# Patient Record
Sex: Female | Born: 1980
Health system: Southern US, Community
[De-identification: ages and names within clinical notes are randomized; demographics above are authoritative.]

## PROBLEM LIST (undated history)

## (undated) DIAGNOSIS — T7840XA Allergy, unspecified, initial encounter: Secondary | ICD-10-CM

## (undated) DIAGNOSIS — G971 Other reaction to spinal and lumbar puncture: Secondary | ICD-10-CM

## (undated) DIAGNOSIS — K219 Gastro-esophageal reflux disease without esophagitis: Secondary | ICD-10-CM

## (undated) DIAGNOSIS — Z9889 Other specified postprocedural states: Secondary | ICD-10-CM

## (undated) DIAGNOSIS — J45909 Unspecified asthma, uncomplicated: Secondary | ICD-10-CM

## (undated) DIAGNOSIS — Z86018 Personal history of other benign neoplasm: Secondary | ICD-10-CM

## (undated) DIAGNOSIS — R112 Nausea with vomiting, unspecified: Secondary | ICD-10-CM

## (undated) DIAGNOSIS — A609 Anogenital herpesviral infection, unspecified: Secondary | ICD-10-CM

## (undated) DIAGNOSIS — D649 Anemia, unspecified: Secondary | ICD-10-CM

## (undated) HISTORY — PX: MYOMECTOMY: SHX85

## (undated) HISTORY — DX: Allergy, unspecified, initial encounter: T78.40XA

---

## 2005-03-31 ENCOUNTER — Ambulatory Visit: Payer: Self-pay | Admitting: Internal Medicine

## 2007-08-30 DIAGNOSIS — G971 Other reaction to spinal and lumbar puncture: Secondary | ICD-10-CM

## 2007-08-30 HISTORY — DX: Other reaction to spinal and lumbar puncture: G97.1

## 2009-11-20 ENCOUNTER — Ambulatory Visit: Payer: Self-pay | Admitting: Internal Medicine

## 2009-11-20 DIAGNOSIS — R05 Cough: Secondary | ICD-10-CM

## 2009-11-20 DIAGNOSIS — R059 Cough, unspecified: Secondary | ICD-10-CM | POA: Insufficient documentation

## 2010-09-28 NOTE — Assessment & Plan Note (Signed)
Summary: Pulmonary/ new pt eval for cough   Visit Type:  Initial Consult Copy to:  Self Primary Provider/Referring Provider:  none  CC:  Cough.  History of Present Illness: 76 yobf never smoker with h/o allergic rhinitis on clariton short courses only until mid 20's and resolved  2006 seen in pulmonary clinic for cyclical cough rx delsym tramadol and ppi and resolved completely.  November 20, 2009 cc recurrent cough x 6 weeks onset viral uri with sorethroat rx codeine cough syrup and as needed proventil no help.  URI and sore throat symptoms resolved but cough since then persistent not progressive, worse day than night and no noct disturbance or early am exac, mostly dry.  cxr and sinus xrays nl already done.  Pt denies any significant sore throat, dysphagia, itching, sneezing,  nasal congestion or excess secretions,  fever, chills, sweats, unintended wt loss, pleuritic or exertional cp, hempoptysis, change in activity tolerance  orthopnea pnd or leg swelling Pt also denies any obvious fluctuation in symptoms with weather or environmental change or other alleviating or aggravating factors.         Current Medications (verified): 1)  Proventil Hfa 108 (90 Base) Mcg/act Aers (Albuterol Sulfate) .... 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  The patient denies shortness of breath with activity, shortness of breath at rest, productive cough, coughing up blood, chest pain, irregular heartbeats, acid heartburn, indigestion, loss of appetite, weight change, abdominal pain, difficulty swallowing, tooth/dental problems, headaches, nasal congestion/difficulty  breathing through nose, sneezing, itching, ear ache, anxiety, depression, hand/feet swelling, joint stiffness or pain, rash, change in color of mucus, and fever.    Vital Signs:  Patient profile:   30 year old female Height:      62 inches Weight:      149 pounds BMI:     27.35 O2 Sat:      99 % on Room air Temp:     98.4 degrees F oral Pulse rate:   85 / minute BP sitting:   112 / 70  (left arm)  Vitals Entered By: Vernie Murders (November 20, 2009 10:33 AM)  O2 Flow:  Room air  Physical Exam  Additional Exam:  wt 149 November 21, 2009 amb pleasant bf nad HEENT: nl dentition, and orophanx. mod nonspecific turbinate edema.  Nl external ear canals without cough reflex NECK :  without JVD/Nodes/TM/ nl carotid upstrokes bilaterally LUNGS: no acc muscle use, clear to A and P bilaterally with cough on insp  CV:  RRR  no s3 or murmur or increase in P2, no edema  ABD:  soft and nontender with nl excursion in the supine position. No bruits or organomegaly, bowel sounds nl MS:  warm without deformities, calf tenderness, cyanosis or clubbing SKIN: warm and dry without lesions   NEURO:  alert, approp, no deficits     Impression & Recommendations:  Problem # 1:  COUGH (ICD-786.2)  The most common causes of chronic cough in immunocompetent adults include: upper airway cough syndrome (UACS), previously referred to as postnasal drip syndrome,  caused by variety of rhinosinus conditions; (2) asthma; (3) GERD; (4) chronic bronchitis from cigarette smoking or other inhaled environmental irritants; (5) nonasthmatic eosinophilic  bronchitis; and (6) bronchiectasis. These conditions, singly or in combination, have accounted for up to 94% of the causes of chronic cough in prospective studies.   Of the three most common causes of chronic cough, only one (GERD) can actually trigger or exacerbate the other two and perpetuate the cylce of cough inducing airway trauma, inflammation, heightened sensitivity to  reflux which is prompted by the cough itself via a cyclical mechanism.  This may partially respond to steroids and look like asthma and post nasal drainage but never erradicated completely unless the cough and the secondary reflux are eliminated, preferably both at the same time.    See instructions for specific recommendations   Orders: New Patient Level V (14782)  Medications Added to Medication List This Visit: 1)  Proventil Hfa 108 (90 Base) Mcg/act Aers (Albuterol sulfate) .... 2 puffs every 4-6 hours as needed 2)  Tramadol Hcl 50 Mg Tabs (Tramadol hcl) .... One to two by mouth every 4-6 hours if coughing 3)  Prednisone 10 Mg Tabs (Prednisone) .... 4 each am x 2days, 2x2days, 1x2days and stop  Patient Instructions: 1)  Acid reflux is a  leading suspect here and needs to be eliminated  completely before considering additional studies or treatment options. To suppress this maximally, take prilosec 20mg   Take  one 30-60 min before first meal of the day until cough is gone 2)  Take delsym two tsp every 12 hours and add tramadol 50 mg up to 2 every 4 hours to suppress the urge to cough. Swallowing water or using ice chips/non mint and menthol containing candies (such as lifesavers or sugarless jolly ranchers) are also effective.  3)  Prednisone 4 each am x 2days, 2x2days, 1x2days and stop  4)  GERD (REFLUX)  is a common cause of respiratory symptoms. It commonly presents without heartburn and can be treated with medication, but also with lifestyle changes including avoidance of late meals, excessive alcohol, smoking cessation, and avoid fatty foods, chocolate, peppermint, colas, red wine, and acidic juices such as orange juice. NO MINT OR MENTHOL PRODUCTS SO NO COUGH DROPS  5)  USE SUGARLESS CANDY INSTEAD (jolley ranchers)  6)  NO OIL BASED VITAMINS  Prescriptions: PREDNISONE 10 MG  TABS (PREDNISONE) 4 each am x 2days, 2x2days, 1x2days and stop  #14 x 0   Entered and Authorized by:   Nyoka Cowden MD   Signed by:   Nyoka Cowden MD on 11/20/2009   Method used:   [facility]ronically to        [facility] [address] [phone]* (retail)       [address]       [place], [place]  [id number]       Ph: [phone] or [phone]       Fax: [phone]   RxID:   [id number] TRAMADOL HCL 50 MG  TABS (TRAMADOL HCL) One to two by mouth every 4-6 hours if coughing  #40 x 0   Entered and Authorized by:   Nyoka Cowden MD   Signed by:   Nyoka Cowden MD on 11/20/2009   Method used:   [facility]        [facility]  [address] [phone] (retail)       [address]       [place], [place]  [id number]       Ph: [phone]  or [phone]       Fax: [phone]   RxID:   [id number]

## 2011-01-31 ENCOUNTER — Telehealth: Payer: Self-pay | Admitting: Internal Medicine

## 2011-01-31 NOTE — Telephone Encounter (Signed)
All I can recommend is otcs at this point:  Prilosec Take 2 puffs first thing in am and then another 2 puffs about 12 hours later and pepcid at bedtime 20 mg  For cough > delsym is the strongest non-narcotic available, and we can't call in anything stronger.  One option would be to go to an Urgent care and give their fax and we can sent the last ov so they can teat her the same way we did - that way she doesn't have to wait to see a specialist.

## 2011-01-31 NOTE — Telephone Encounter (Signed)
Pt advised of recs.Samantha Francis, CMA  

## 2011-01-31 NOTE — Telephone Encounter (Signed)
Samantha last seen 11-20-2009 by Dr. Sherene Sires for cough. Samantha states she lives in [place] and MW told her at last OV she could call in if she needed an RX and he would try to help her because she lives so far away. Samantha states she has been having a dry cough x 1 month again. She also has head congestion as well.  She states she has tried to get an appt in [place] but it is a 2 month waiting period. I advised the Samantha that she had to be seen within a year for an Rx to be called in. She states she understand but if I could just ask Dr. Sherene Sires she thinks he would make an exception. Samantha is not currently on any meds.  I advised I would need to send this directly to MW but he is not in office until tomorrow AM. Samantha ok to wait till then for response. Please advise. Carron Curie, CMA

## 2011-02-01 ENCOUNTER — Telehealth: Payer: Self-pay | Admitting: *Deleted

## 2011-02-01 NOTE — Telephone Encounter (Signed)
[id number]

## 2011-02-01 NOTE — Telephone Encounter (Signed)
Pt returned call.  States she is taking the prilosec twice daily and delsym for cough.  States the delsym did not help last night.  She has not started the pepcid at bedtime but states she will get some.  Pt requesting OV this Friday as she will be coming in from out of town for appt.  OV scheduled with Dr. Craige Cotta for 02/04/11 at 1:30pm as there are no openings available with Dr. Sherene Sires and TP is out of office.  Pt aware.

## 2011-02-04 ENCOUNTER — Encounter: Payer: Self-pay | Admitting: Pulmonary Disease

## 2011-02-04 ENCOUNTER — Ambulatory Visit (INDEPENDENT_AMBULATORY_CARE_PROVIDER_SITE_OTHER): Payer: Self-pay | Admitting: Pulmonary Disease

## 2011-02-04 VITALS — BP 116/78 | HR 95 | Temp 98.7°F | Ht 62.0 in | Wt 158.4 lb

## 2011-02-04 DIAGNOSIS — R05 Cough: Secondary | ICD-10-CM

## 2011-02-04 DIAGNOSIS — R059 Cough, unspecified: Secondary | ICD-10-CM

## 2011-02-04 MED ORDER — MOMETASONE FUROATE 50 MCG/ACT NA SUSP: NASAL | Status: DC

## 2011-02-04 MED ORDER — BENZONATATE 200 MG PO CAPS: 200.0000 mg | ORAL_CAPSULE | Freq: Three times a day (TID) | ORAL | Status: AC | PRN

## 2011-02-04 NOTE — Progress Notes (Signed)
Subjective:    Patient ID: Samantha Francis, female    DOB: 06-Mar-1981, 30 y.o.   MRN: [id number]  HPI 30 yo female never smoker with chronic cough.  She had a cold about 6 weeks ago. She had a cough with yellow sputum for a few days.  She was also having sinus congestion and post-nasal drip.  She is not having sputum production, but her cough persists.  She feels drainage in her throat, and has a globus/itchy feeling.  She has not had fever, wheeze, or chest tightness.  She does not smoke.  Her daughter also had a cold and cough.  She restarted her stomach medicine two days ago, and this helped some.  Past Medical History  Diagnosis Date  . Allergic rhinitis      No family history on file.   History   Social History  . Marital Status: Married    Spouse Name: N/A    Number of Children: N/A  . Years of Education: N/A   Occupational History  . community support specialist    Social History Main Topics  . Smoking status: Never Smoker   . Smokeless tobacco: Not on file  . Alcohol Use: Not on file  . Drug Use: Not on file  . Sexually Active: Not on file   Other Topics Concern  . Not on file   Social History Narrative  . No narrative on file     No Known Allergies   Outpatient Prescriptions Prior to Visit  Medication Sig Dispense Refill  . traMADol (ULTRAM) 50 MG tablet Take 50 mg by mouth every 6 (six) hours as needed.           Review of Systems     Objective:   Physical Exam  BP 116/78  Pulse 95  Temp(Src) 98.7 F (37.1 C) (Oral)  Ht 5\' 2"  (1.575 m)  Wt 158 lb 6.4 oz (71.85 kg)  BMI 28.97 kg/m2  SpO2 98%  General - Healthy HEENT - no sinus tenderness, clear nasal discharge, boggy mucosa, no oral exudate, no LAN Cardiac - s1s2 regular, no murmur Chest - CTA Abd - soft, non-tender Ext - no e/c/c Neuro - normal strength Psych - normal mood/behavior     Assessment & Plan:   Cough She has recurrent cough after recent viral URI.  She has sinus  congestion with post-nasal drip.  Will give her nasal irrigation and nasonex.  She can use salt water gargles, and sugarless candy.  Will give her script for benzonatate.  She is to continue anti-reflux therapy.  No clear evidence for asthma, and will hold off on using inhalers for now.    Updated Medication List Outpatient Encounter Prescriptions as of 02/04/2011  Medication Sig Dispense Refill  . dextromethorphan (DELSYM) 30 MG/5ML liquid Take 60 mg by mouth as needed.        . famotidine (PEPCID AC) 10 MG chewable tablet Chew 10 mg by mouth at bedtime.        Marland Kitchen omeprazole (PRILOSEC OTC) 20 MG tablet Take 20 mg by mouth every morning.        . benzonatate (TESSALON) 200 MG capsule Take 1 capsule (200 mg total) by mouth 3 (three) times daily as needed for cough.  30 capsule  1  . mometasone (NASONEX) 50 MCG/ACT nasal spray Two sprays each nostril daily for one week, then as needed  17 g  2  . DISCONTD: traMADol (ULTRAM) 50 MG tablet Take 50 mg by  mouth every 6 (six) hours as needed.

## 2011-02-04 NOTE — Assessment & Plan Note (Signed)
She has recurrent cough after recent viral URI.  She has sinus congestion with post-nasal drip.  Will give her nasal irrigation and nasonex.  She can use salt water gargles, and sugarless candy.  Will give her script for benzonatate.  She is to continue anti-reflux therapy.  No clear evidence for asthma, and will hold off on using inhalers for now.

## 2011-02-04 NOTE — Patient Instructions (Signed)
Nasal irrigation (saline nasal spray) daily for one week, then as needed Nasonex two sprays each nostril daily for one week, then as needed Salt water gargle as needed Sugarless candy as needed to keep mouth moist Drink sip of water when you have urge to cough Benzonatate one pill three times per day as needed for cough  Follow up with Dr. Sherene Sires in 4 to 6 weeks

## 2011-03-04 ENCOUNTER — Ambulatory Visit (INDEPENDENT_AMBULATORY_CARE_PROVIDER_SITE_OTHER): Payer: Self-pay | Admitting: Internal Medicine

## 2011-03-04 ENCOUNTER — Encounter: Payer: Self-pay | Admitting: Internal Medicine

## 2011-03-04 VITALS — BP 130/74 | HR 94 | Temp 97.8°F | Ht 62.0 in | Wt 158.0 lb

## 2011-03-04 DIAGNOSIS — R05 Cough: Secondary | ICD-10-CM

## 2011-03-04 DIAGNOSIS — R059 Cough, unspecified: Secondary | ICD-10-CM

## 2011-03-04 MED ORDER — FAMOTIDINE 20 MG PO TABS: ORAL_TABLET | ORAL | Status: DC

## 2011-03-04 MED ORDER — TRAMADOL HCL 50 MG PO TABS: ORAL_TABLET | ORAL | Status: AC

## 2011-03-04 MED ORDER — PREDNISONE (PAK) 10 MG PO TABS: ORAL_TABLET | ORAL | Status: AC

## 2011-03-04 NOTE — Progress Notes (Signed)
Subjective:    Patient ID: Samantha Francis, female    DOB: 03-04-1981, 30 y.o.   MRN: [id number]  Cough   30 yo female never smoker with chronic cough  Pattern of recurrent cough dating back around 2008 typically flares with uri's sev times a year but between flares has no symptoms at atll and no meds between flares several times a year and only takes bcp's as maint rx   Prev effective rx 11/20/09  1) Acid reflux is a leading suspect here and needs to be eliminated completely before considering additional studies or treatment options. To suppress this maximally, take prilosec 20mg  Take one 30-60 min before first meal of the day until cough is gone  2) Take delsym two tsp every 12 hours and add tramadol 50 mg up to 2 every 4 hours to suppress the urge to cough. Swallowing water or using ice chips/non mint and menthol containing candies (such as lifesavers or sugarless jolly ranchers) are also effective.  3) Prednisone 4 each am x 2days, 2x2days, 1x2days and stop  4) GERD diet   03/04/2011 ov/Samantha Francis  Cough no better x 2 months,  Worse at work using voice and walking, driving.  Not as much at night, minimal mucus with no discoloration at present, so far no better on ppi/ h2 at hs.  No prednisone yet this flare.  Pt denies any significant sore throat, dysphagia, itching, sneezing,  nasal congestion or excess/ purulent secretions,  fever, chills, sweats, unintended wt loss, pleuritic or exertional cp, hempoptysis, orthopnea pnd or leg swelling.    Also denies any obvious fluctuation of symptoms with weather or environmental changes or other aggravating or alleviating factors.  Sleeping ok without nocturnal  or early am exac of resp c/o's          Past Medical History  Diagnosis Date  . Allergic rhinitis      No family history on file.   History   Social History  . Marital Status: Married    Spouse Name: N/A    Number of Children: N/A  . Years of Education: N/A   Occupational  History  . community support specialist    Social History Main Topics  . Smoking status: Never Smoker   . Smokeless tobacco: Not on file  . Alcohol Use: Not on file  . Drug Use: Not on file  . Sexually Active: Not on file   Other Topics Concern  . Not on file   Social History Narrative  . No narrative on file     No Known Allergies   Outpatient Prescriptions Prior to Visit  Medication Sig Dispense Refill  . dextromethorphan (DELSYM) 30 MG/5ML liquid Take 60 mg by mouth as needed.        . famotidine (PEPCID AC) 10 MG chewable tablet Chew 10 mg by mouth at bedtime.        Marland Kitchen omeprazole (PRILOSEC OTC) 20 MG tablet Take 20 mg by mouth every morning.        . mometasone (NASONEX) 50 MCG/ACT nasal spray Two sprays each nostril daily for one week, then as needed  17 g  2     Review of Systems  Respiratory: Positive for cough.        Objective:   Physical Exam  amb bf nad  Wt 158 03/04/11   General - Healthy HEENT - no sinus tenderness, clear nasal discharge, boggy mucosa, no oral exudate, no LAN ardiac - s1s2 regular, no murmur Chest -  CTA Abd - soft, non-tender Ext - no e/c/c Neuro - normal strength Psych - normal mood/behavior     Assessment & Plan:      Outpatient Encounter Prescriptions as of 03/04/2011  Medication Sig Dispense Refill  . dextromethorphan (DELSYM) 30 MG/5ML liquid Take 60 mg by mouth as needed.        . famotidine (PEPCID AC) 10 MG chewable tablet Chew 10 mg by mouth at bedtime.        Marland Kitchen omeprazole (PRILOSEC OTC) 20 MG tablet Take 20 mg by mouth every morning.        . sodium chloride (OCEAN) 0.65 % nasal spray Place 1 spray into the nose daily.        . mometasone (NASONEX) 50 MCG/ACT nasal spray Two sprays each nostril daily for one week, then as needed  17 g  2

## 2011-03-04 NOTE — Patient Instructions (Signed)
Take delsym two tsp every 12 hours and supplement if needed with  tramadol 50 mg up to 2 every 4 hours to suppress the urge to cough. Swallowing water or using ice chips/non mint and menthol containing candies (such as lifesavers or sugarless jolly ranchers) are also effective.  You should rest your voice and avoid activities that you know make you cough.  Once you have eliminated the cough for 3 straight days try reducing the tramadol first,  then the delsym as tolerated.    Prednisone 10 mg take  4 each am x 2 days,   2 each am x 2 days,  1 each am x2days and stop   Prilosec 20mg   Take 30- 60 min before your first and last meals of the day and take pepcid 20 mg one at bedtime  For drainage try chlortrimeton 4mg  one every 6 hours (may make you sleepy)  GERD (REFLUX)  is an extremely common cause of respiratory symptoms, many times with no significant heartburn at all.    It can be treated with medication, but also with lifestyle changes including avoidance of late meals, excessive alcohol, smoking cessation, and avoid fatty foods, chocolate, peppermint, colas, red wine, and acidic juices such as orange juice.  NO MINT OR MENTHOL PRODUCTS SO NO COUGH DROPS  USE SUGARLESS CANDY INSTEAD (jolley ranchers or Stover's)  NO OIL BASED VITAMINS   Call  if not 100% satisfied to set up  sinus ct scan and allergy evaluation next

## 2011-03-05 NOTE — Assessment & Plan Note (Signed)
The most common causes of chronic cough in immunocompetent adults include the following: upper airway cough syndrome (UACS), previously referred to as postnasal drip syndrome (PNDS), which is caused by variety of rhinosinus conditions; (2) asthma; (3) GERD; (4) chronic bronchitis from cigarette smoking or other inhaled environmental irritants; (5) nonasthmatic eosinophilic bronchitis; and (6) bronchiectasis.   These conditions, singly or in combination, have accounted for up to 94% of the causes of chronic cough in prospective studies.   Other conditions have constituted no >6% of the causes in prospective studies These have included bronchogenic carcinoma, chronic interstitial pneumonia, sarcoidosis, left ventricular failure, ACEI-induced cough, and aspiration from a condition associated with pharyngeal dysfunction.   This is most likely  Classic Upper airway cough syndrome, so named because it's frequently impossible to sort out how much is  CR/sinusitis with freq throat clearing (which can be related to primary GERD)   vs  causing  secondary (" extra esophageal")  GERD from wide swings in gastric pressure that occur with throat clearing, often  promoting self use of mint and menthol lozenges that reduce the lower esophageal sphincter tone and exacerbate the problem further in a cyclical fashion.   These are the same pts who not infrequently have failed to tolerate ace inhibitors,  dry powder inhalers or biphosphonates or report having reflux symptoms that don't respond to standard doses of PPI , and are easily confused as having aecopd or asthma flares,   Of the three most common causes of chronic cough, only one (GERD)  can actually cause the other two (asthma and post nasal drip syndrome)  and perpetuate the cylce of cough inducing airway trauma, inflammation, heightened sensitivity to reflux which is prompted by the cough itself via a cyclical mechanism.    This may partially respond to steroids  and look like asthma and post nasal drainage but never erradicated completely unless the cough and the secondary reflux are eliminated, preferably both at the same time.  While not intuitively obvious, many patients with chronic low grade reflux do not cough until there is a secondary insult that disturbs the protective epithelial barrier and exposes sensitive nerve endings.  This can be viral or direct physical injury such as with an endotracheal tube.   The point is that once this occurs, it is difficult to eliminate using anything but a maximally effective acid suppression regimen at least in the short run, accompanied by an appropriate diet to address non acid GERD.   See instructions for specific recommendations which were reviewed directly with the patient who was given a copy with highlighter outlining the key components.

## 2012-05-18 ENCOUNTER — Emergency Department (HOSPITAL_COMMUNITY)
Admission: EM | Admit: 2012-05-18 | Discharge: 2012-05-18 | Disposition: A | Discharge status: Home / Self Care | Payer: No Typology Code available for payment source | Attending: Emergency Medicine | Admitting: Emergency Medicine

## 2012-05-18 ENCOUNTER — Emergency Department (HOSPITAL_COMMUNITY): Payer: No Typology Code available for payment source

## 2012-05-18 ENCOUNTER — Encounter (HOSPITAL_COMMUNITY): Payer: Self-pay | Admitting: Nurse Practitioner

## 2012-05-18 DIAGNOSIS — M545 Low back pain, unspecified: Secondary | ICD-10-CM | POA: Diagnosis not present

## 2012-05-18 DIAGNOSIS — O2690 Pregnancy related conditions, unspecified, unspecified trimester: Secondary | ICD-10-CM | POA: Diagnosis present

## 2012-05-18 DIAGNOSIS — Z3490 Encounter for supervision of normal pregnancy, unspecified, unspecified trimester: Secondary | ICD-10-CM

## 2012-05-18 DIAGNOSIS — Y92410 Unspecified street and highway as the place of occurrence of the external cause: Secondary | ICD-10-CM | POA: Insufficient documentation
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
## 2012-05-18 NOTE — ED Notes (Signed)
Pt alert and oriented, with steady gait at time of discharge. Pt given discharge papers and papers explained. All questions answered and pt walked to discharge.  

## 2012-05-18 NOTE — ED Notes (Signed)
Pt to US.

## 2013-12-26 LAB — OB RESULTS CONSOLE GC/CHLAMYDIA
Chlamydia: NEGATIVE
Gonorrhea: NEGATIVE

## 2013-12-26 LAB — OB RESULTS CONSOLE HEPATITIS B SURFACE ANTIGEN: Hepatitis B Surface Ag: NEGATIVE

## 2013-12-26 LAB — OB RESULTS CONSOLE HIV ANTIBODY (ROUTINE TESTING): HIV: NONREACTIVE

## 2013-12-26 LAB — OB RESULTS CONSOLE RUBELLA ANTIBODY, IGM: Rubella: IMMUNE

## 2013-12-26 LAB — OB RESULTS CONSOLE RPR: RPR: NONREACTIVE

## 2013-12-26 LAB — OB RESULTS CONSOLE ABO/RH: RH TYPE: POSITIVE

## 2013-12-26 LAB — OB RESULTS CONSOLE ANTIBODY SCREEN: ANTIBODY SCREEN: POSITIVE

## 2014-06-04 ENCOUNTER — Encounter (HOSPITAL_COMMUNITY): Payer: Self-pay | Admitting: Pharmacist

## 2014-06-06 ENCOUNTER — Encounter (HOSPITAL_COMMUNITY): Payer: Self-pay

## 2014-06-06 NOTE — Patient Instructions (Signed)
Your procedure is scheduled on:  Tuesday, Oct. 13, 2015  Enter through the [employer] of [facility] at:  9:45 A.M.  Pick up the phone at the desk and dial [phone].  Call this number if you have problems the morning of surgery: [phone].  Remember: Do NOT eat food:  AFTER MIDNIGHT TONIGHT Do NOT drink clear liquids after: AFTER MIDNIGHT TONIGHT Take these medicines the morning of surgery with a SIP OF WATER: NONE  Do NOT wear jewelry (body piercing), metal hair clips/bobby pins, or nail polish. Do NOT wear lotions, powders, or perfumes.  You may wear deoderant. Do NOT shave for 48 hours prior to surgery. Do NOT bring valuables to the hospital. Leave suitcase in car.  After surgery it may be brought to your room.  For patients admitted to the hospital, checkout time is 11:00 AM the day of discharge.

## 2014-06-09 ENCOUNTER — Encounter (HOSPITAL_COMMUNITY)
Admission: RE | Admit: 2014-06-09 | Discharge: 2014-06-09 | Disposition: A | Discharge status: Home / Self Care | Payer: No Typology Code available for payment source | Source: Ambulatory Visit | Attending: Obstetrics and Gynecology | Admitting: Obstetrics and Gynecology

## 2014-06-09 ENCOUNTER — Encounter (HOSPITAL_COMMUNITY): Payer: Self-pay

## 2014-06-09 HISTORY — DX: Anemia, unspecified: D64.9

## 2014-06-09 HISTORY — DX: Nausea with vomiting, unspecified: R11.2

## 2014-06-09 HISTORY — DX: Personal history of other benign neoplasm: Z86.018

## 2014-06-09 HISTORY — DX: Unspecified asthma, uncomplicated: J45.909

## 2014-06-09 HISTORY — DX: Other reaction to spinal and lumbar puncture: G97.1

## 2014-06-09 HISTORY — DX: Other specified postprocedural states: Z98.890

## 2014-06-09 HISTORY — DX: Anogenital herpesviral infection, unspecified: A60.9

## 2014-06-09 HISTORY — DX: Gastro-esophageal reflux disease without esophagitis: K21.9

## 2014-06-09 LAB — CBC
HCT: 34.4 % — ABNORMAL LOW (ref 36.0–46.0)
Hemoglobin: 11.6 g/dL — ABNORMAL LOW (ref 12.0–15.0)
MCH: 31 pg (ref 26.0–34.0)
MCHC: 33.7 g/dL (ref 30.0–36.0)
MCV: 92 fL (ref 78.0–100.0)
PLATELETS: 148 10*3/uL — AB (ref 150–400)
RBC: 3.74 MIL/uL — AB (ref 3.87–5.11)
RDW: 14.2 % (ref 11.5–15.5)
WBC: 5.8 10*3/uL (ref 4.0–10.5)

## 2014-06-09 LAB — TYPE AND SCREEN
ABO/RH(D): O POS
Antibody Screen: NEGATIVE

## 2014-06-09 LAB — RPR

## 2014-06-09 LAB — ABO/RH: ABO/RH(D): O POS

## 2014-06-10 ENCOUNTER — Encounter (HOSPITAL_COMMUNITY): Payer: Self-pay | Admitting: Anesthesiology

## 2014-06-10 ENCOUNTER — Encounter (HOSPITAL_COMMUNITY): Payer: No Typology Code available for payment source | Admitting: Anesthesiology

## 2014-06-10 ENCOUNTER — Inpatient Hospital Stay (HOSPITAL_COMMUNITY)
Admission: RE | Admit: 2014-06-10 | Discharge: 2014-06-13 | DRG: 766 | Disposition: A | Discharge status: Home / Self Care | Payer: No Typology Code available for payment source | Source: Ambulatory Visit | Attending: Obstetrics and Gynecology | Admitting: Obstetrics and Gynecology

## 2014-06-10 ENCOUNTER — Inpatient Hospital Stay (HOSPITAL_COMMUNITY): Payer: No Typology Code available for payment source | Admitting: Anesthesiology

## 2014-06-10 ENCOUNTER — Encounter (HOSPITAL_COMMUNITY)
Admission: RE | Disposition: A | Discharge status: Home / Self Care | Payer: Self-pay | Source: Ambulatory Visit | Attending: Obstetrics and Gynecology

## 2014-06-10 DIAGNOSIS — Z3A39 39 weeks gestation of pregnancy: Secondary | ICD-10-CM | POA: Diagnosis present

## 2014-06-10 DIAGNOSIS — K219 Gastro-esophageal reflux disease without esophagitis: Secondary | ICD-10-CM | POA: Diagnosis present

## 2014-06-10 DIAGNOSIS — O9962 Diseases of the digestive system complicating childbirth: Secondary | ICD-10-CM | POA: Diagnosis present

## 2014-06-10 DIAGNOSIS — O3421 Maternal care for scar from previous cesarean delivery: Principal | ICD-10-CM | POA: Diagnosis present

## 2014-06-10 SURGERY — Surgical Case
Anesthesia: Spinal

## 2014-06-10 MED ORDER — DIPHENHYDRAMINE HCL 25 MG PO CAPS: 25.0000 mg | ORAL_CAPSULE | ORAL | Status: DC | PRN

## 2014-06-10 MED ORDER — MORPHINE SULFATE 0.5 MG/ML IJ SOLN
INTRAMUSCULAR | Status: AC
  Filled 2014-06-10: qty 10

## 2014-06-10 MED ORDER — MENTHOL 3 MG MT LOZG: 1.0000 | LOZENGE | OROMUCOSAL | Status: DC | PRN

## 2014-06-10 MED ORDER — NALBUPHINE HCL 10 MG/ML IJ SOLN
5.0000 mg | Freq: Once | INTRAMUSCULAR | Status: AC | PRN
  Administered 2014-06-10: 5 mg via INTRAVENOUS

## 2014-06-10 MED ORDER — FENTANYL CITRATE 0.05 MG/ML IJ SOLN
INTRAMUSCULAR | Status: AC
  Filled 2014-06-10: qty 2

## 2014-06-10 MED ORDER — ONDANSETRON HCL 4 MG/2ML IJ SOLN
INTRAMUSCULAR | Status: AC
  Filled 2014-06-10: qty 2

## 2014-06-10 MED ORDER — KETOROLAC TROMETHAMINE 30 MG/ML IJ SOLN: 15.0000 mg | Freq: Once | INTRAMUSCULAR | Status: DC | PRN

## 2014-06-10 MED ORDER — ONDANSETRON HCL 4 MG PO TABS: 4.0000 mg | ORAL_TABLET | ORAL | Status: DC | PRN

## 2014-06-10 MED ORDER — CEFAZOLIN SODIUM-DEXTROSE 2-3 GM-% IV SOLR
2.0000 g | INTRAVENOUS | Status: AC
  Administered 2014-06-10: 2 g via INTRAVENOUS
  Filled 2014-06-10: qty 50

## 2014-06-10 MED ORDER — FENTANYL CITRATE 0.05 MG/ML IJ SOLN: 25.0000 ug | INTRAMUSCULAR | Status: DC | PRN

## 2014-06-10 MED ORDER — PHENYLEPHRINE 8 MG IN D5W 100 ML (0.08MG/ML) PREMIX OPTIME
INJECTION | INTRAVENOUS | Status: DC | PRN
  Administered 2014-06-10: 60 ug/min via INTRAVENOUS

## 2014-06-10 MED ORDER — MEPERIDINE HCL 25 MG/ML IJ SOLN: 6.2500 mg | INTRAMUSCULAR | Status: DC | PRN

## 2014-06-10 MED ORDER — FENTANYL CITRATE 0.05 MG/ML IJ SOLN
INTRAMUSCULAR | Status: DC | PRN
  Administered 2014-06-10: 25 ug via INTRATHECAL

## 2014-06-10 MED ORDER — IBUPROFEN 600 MG PO TABS
600.0000 mg | ORAL_TABLET | Freq: Four times a day (QID) | ORAL | Status: DC
  Administered 2014-06-10 – 2014-06-13 (×10): 600 mg via ORAL
  Filled 2014-06-10 (×10): qty 1

## 2014-06-10 MED ORDER — KETOROLAC TROMETHAMINE 30 MG/ML IJ SOLN
INTRAMUSCULAR | Status: AC
  Administered 2014-06-10: 30 mg via INTRAMUSCULAR
  Filled 2014-06-10: qty 1

## 2014-06-10 MED ORDER — DIBUCAINE 1 % RE OINT: 1.0000 "application " | TOPICAL_OINTMENT | RECTAL | Status: DC | PRN

## 2014-06-10 MED ORDER — LANOLIN HYDROUS EX OINT: 1.0000 "application " | TOPICAL_OINTMENT | CUTANEOUS | Status: DC | PRN

## 2014-06-10 MED ORDER — NALBUPHINE HCL 10 MG/ML IJ SOLN: 5.0000 mg | INTRAMUSCULAR | Status: DC | PRN

## 2014-06-10 MED ORDER — SIMETHICONE 80 MG PO CHEW
80.0000 mg | CHEWABLE_TABLET | ORAL | Status: DC
  Administered 2014-06-10 – 2014-06-12 (×3): 80 mg via ORAL
  Filled 2014-06-10 (×3): qty 1

## 2014-06-10 MED ORDER — SIMETHICONE 80 MG PO CHEW
80.0000 mg | CHEWABLE_TABLET | ORAL | Status: DC | PRN
  Administered 2014-06-11 (×2): 80 mg via ORAL

## 2014-06-10 MED ORDER — NALOXONE HCL 0.4 MG/ML IJ SOLN
0.4000 mg | INTRAMUSCULAR | Status: DC | PRN
Start: 2014-06-10 — End: 2014-06-13

## 2014-06-10 MED ORDER — WITCH HAZEL-GLYCERIN EX PADS: 1.0000 "application " | MEDICATED_PAD | CUTANEOUS | Status: DC | PRN

## 2014-06-10 MED ORDER — DIPHENHYDRAMINE HCL 50 MG/ML IJ SOLN: 12.5000 mg | INTRAMUSCULAR | Status: DC | PRN

## 2014-06-10 MED ORDER — SCOPOLAMINE 1 MG/3DAYS TD PT72: 1.0000 | MEDICATED_PATCH | Freq: Once | TRANSDERMAL | Status: DC

## 2014-06-10 MED ORDER — CEFAZOLIN SODIUM-DEXTROSE 2-3 GM-% IV SOLR
INTRAVENOUS | Status: AC
  Filled 2014-06-10: qty 50

## 2014-06-10 MED ORDER — LACTATED RINGERS IV SOLN
INTRAVENOUS | Status: DC
  Administered 2014-06-10 (×2): via INTRAVENOUS
  Administered 2014-06-10: 125 mL/h via INTRAVENOUS

## 2014-06-10 MED ORDER — ONDANSETRON HCL 4 MG/2ML IJ SOLN: 4.0000 mg | INTRAMUSCULAR | Status: DC | PRN

## 2014-06-10 MED ORDER — PHENYLEPHRINE 8 MG IN D5W 100 ML (0.08MG/ML) PREMIX OPTIME
INJECTION | INTRAVENOUS | Status: AC
  Filled 2014-06-10: qty 100

## 2014-06-10 MED ORDER — NALBUPHINE HCL 10 MG/ML IJ SOLN
5.0000 mg | Freq: Once | INTRAMUSCULAR | Status: AC | PRN
  Filled 2014-06-10: qty 1

## 2014-06-10 MED ORDER — SIMETHICONE 80 MG PO CHEW
80.0000 mg | CHEWABLE_TABLET | Freq: Three times a day (TID) | ORAL | Status: DC
  Administered 2014-06-11 – 2014-06-13 (×5): 80 mg via ORAL
  Filled 2014-06-10 (×7): qty 1

## 2014-06-10 MED ORDER — ZOLPIDEM TARTRATE 5 MG PO TABS: 5.0000 mg | ORAL_TABLET | Freq: Every evening | ORAL | Status: DC | PRN

## 2014-06-10 MED ORDER — SCOPOLAMINE 1 MG/3DAYS TD PT72
1.0000 | MEDICATED_PATCH | Freq: Once | TRANSDERMAL | Status: DC
  Administered 2014-06-10: 1.5 mg via TRANSDERMAL

## 2014-06-10 MED ORDER — NALBUPHINE HCL 10 MG/ML IJ SOLN
5.0000 mg | INTRAMUSCULAR | Status: DC | PRN
  Administered 2014-06-10: 5 mg via INTRAVENOUS
  Filled 2014-06-10: qty 1

## 2014-06-10 MED ORDER — LACTATED RINGERS IV SOLN
INTRAVENOUS | Status: DC
  Administered 2014-06-11: 06:00:00 via INTRAVENOUS

## 2014-06-10 MED ORDER — ONDANSETRON HCL 4 MG/2ML IJ SOLN
INTRAMUSCULAR | Status: DC | PRN
  Administered 2014-06-10: 4 mg via INTRAVENOUS

## 2014-06-10 MED ORDER — NALOXONE HCL 1 MG/ML IJ SOLN
1.0000 ug/kg/h | INTRAVENOUS | Status: DC | PRN
  Filled 2014-06-10: qty 2

## 2014-06-10 MED ORDER — BUPIVACAINE IN DEXTROSE 0.75-8.25 % IT SOLN
INTRATHECAL | Status: DC | PRN
  Administered 2014-06-10: 1.5 mL via INTRATHECAL

## 2014-06-10 MED ORDER — OXYTOCIN 40 UNITS IN LACTATED RINGERS INFUSION - SIMPLE MED: 62.5000 mL/h | INTRAVENOUS | Status: AC

## 2014-06-10 MED ORDER — ACETAMINOPHEN 500 MG PO TABS
1000.0000 mg | ORAL_TABLET | Freq: Four times a day (QID) | ORAL | Status: AC
  Administered 2014-06-10 – 2014-06-11 (×2): 1000 mg via ORAL
  Filled 2014-06-10 (×2): qty 2

## 2014-06-10 MED ORDER — KETOROLAC TROMETHAMINE 30 MG/ML IJ SOLN
30.0000 mg | Freq: Four times a day (QID) | INTRAMUSCULAR | Status: DC | PRN
  Administered 2014-06-10: 30 mg via INTRAMUSCULAR

## 2014-06-10 MED ORDER — OXYCODONE-ACETAMINOPHEN 5-325 MG PO TABS
1.0000 | ORAL_TABLET | ORAL | Status: DC | PRN
Start: 2014-06-10 — End: 2014-06-13
  Administered 2014-06-11 – 2014-06-13 (×4): 1 via ORAL
  Filled 2014-06-10 (×4): qty 1

## 2014-06-10 MED ORDER — MIDAZOLAM HCL 2 MG/2ML IJ SOLN: 0.5000 mg | Freq: Once | INTRAMUSCULAR | Status: DC | PRN

## 2014-06-10 MED ORDER — LACTATED RINGERS IV SOLN
40.0000 [IU] | INTRAVENOUS | Status: DC | PRN
  Administered 2014-06-10: 40 [IU] via INTRAVENOUS

## 2014-06-10 MED ORDER — SENNOSIDES-DOCUSATE SODIUM 8.6-50 MG PO TABS
2.0000 | ORAL_TABLET | ORAL | Status: DC
  Administered 2014-06-10 – 2014-06-12 (×3): 2 via ORAL
  Filled 2014-06-10 (×3): qty 2

## 2014-06-10 MED ORDER — DIPHENHYDRAMINE HCL 25 MG PO CAPS: 25.0000 mg | ORAL_CAPSULE | Freq: Four times a day (QID) | ORAL | Status: DC | PRN

## 2014-06-10 MED ORDER — PRENATAL MULTIVITAMIN CH
1.0000 | ORAL_TABLET | Freq: Every day | ORAL | Status: DC
  Administered 2014-06-11 – 2014-06-12 (×2): 1 via ORAL
  Filled 2014-06-10 (×2): qty 1

## 2014-06-10 MED ORDER — TETANUS-DIPHTH-ACELL PERTUSSIS 5-2.5-18.5 LF-MCG/0.5 IM SUSP
0.5000 mL | Freq: Once | INTRAMUSCULAR | Status: AC
  Administered 2014-06-10: 0.5 mL via INTRAMUSCULAR

## 2014-06-10 MED ORDER — OXYCODONE-ACETAMINOPHEN 5-325 MG PO TABS: 2.0000 | ORAL_TABLET | ORAL | Status: DC | PRN

## 2014-06-10 MED ORDER — ONDANSETRON HCL 4 MG/2ML IJ SOLN: 4.0000 mg | Freq: Three times a day (TID) | INTRAMUSCULAR | Status: DC | PRN

## 2014-06-10 MED ORDER — OXYTOCIN 10 UNIT/ML IJ SOLN
INTRAMUSCULAR | Status: AC
  Filled 2014-06-10: qty 4

## 2014-06-10 MED ORDER — INFLUENZA VAC SPLIT QUAD 0.5 ML IM SUSY
0.5000 mL | PREFILLED_SYRINGE | INTRAMUSCULAR | Status: AC
  Administered 2014-06-11: 0.5 mL via INTRAMUSCULAR
  Filled 2014-06-10: qty 0.5

## 2014-06-10 MED ORDER — KETOROLAC TROMETHAMINE 30 MG/ML IJ SOLN: 30.0000 mg | Freq: Four times a day (QID) | INTRAMUSCULAR | Status: DC | PRN

## 2014-06-10 MED ORDER — MORPHINE SULFATE (PF) 0.5 MG/ML IJ SOLN
INTRAMUSCULAR | Status: DC | PRN
  Administered 2014-06-10: .15 mg via INTRATHECAL

## 2014-06-10 MED ORDER — SCOPOLAMINE 1 MG/3DAYS TD PT72
MEDICATED_PATCH | TRANSDERMAL | Status: AC
  Filled 2014-06-10: qty 1

## 2014-06-10 MED ORDER — IBUPROFEN 600 MG PO TABS: 600.0000 mg | ORAL_TABLET | Freq: Four times a day (QID) | ORAL | Status: DC | PRN

## 2014-06-10 MED ORDER — PROMETHAZINE HCL 25 MG/ML IJ SOLN: 6.2500 mg | INTRAMUSCULAR | Status: DC | PRN

## 2014-06-10 MED ORDER — SODIUM CHLORIDE 0.9 % IJ SOLN: 3.0000 mL | INTRAMUSCULAR | Status: DC | PRN

## 2014-06-10 SURGICAL SUPPLY — 35 items
CLAMP CORD UMBIL (MISCELLANEOUS) IMPLANT
CLOTH BEACON ORANGE TIMEOUT ST (SAFETY) ×2 IMPLANT
COVER LIGHT HANDLE  1/PK (MISCELLANEOUS) ×2
COVER LIGHT HANDLE 1/PK (MISCELLANEOUS) ×2 IMPLANT
DRAPE SHEET LG 3/4 BI-LAMINATE (DRAPES) IMPLANT
DRSG OPSITE POSTOP 4X10 (GAUZE/BANDAGES/DRESSINGS) ×2 IMPLANT
DRSG TELFA 3X8 NADH (GAUZE/BANDAGES/DRESSINGS) IMPLANT
DURAPREP 26ML APPLICATOR (WOUND CARE) ×2 IMPLANT
ELECT REM PT RETURN 9FT ADLT (ELECTROSURGICAL) ×2
ELECTRODE REM PT RTRN 9FT ADLT (ELECTROSURGICAL) ×1 IMPLANT
EXTRACTOR VACUUM M CUP 4 TUBE (SUCTIONS) IMPLANT
GLOVE BIOGEL PI IND STRL 6.5 (GLOVE) ×1 IMPLANT
GLOVE BIOGEL PI INDICATOR 6.5 (GLOVE) ×1
GLOVE ECLIPSE 6.5 STRL STRAW (GLOVE) ×2 IMPLANT
GOWN STRL REUS W/TWL LRG LVL3 (GOWN DISPOSABLE) ×4 IMPLANT
KIT ABG SYR 3ML LUER SLIP (SYRINGE) IMPLANT
NDL HYPO 25X5/8 SAFETYGLIDE (NEEDLE) IMPLANT
NEEDLE HYPO 25X5/8 SAFETYGLIDE (NEEDLE) IMPLANT
NS IRRIG 1000ML POUR BTL (IV SOLUTION) ×2 IMPLANT
PACK C SECTION WH (CUSTOM PROCEDURE TRAY) ×2 IMPLANT
PAD ABD 7.5X8 STRL (GAUZE/BANDAGES/DRESSINGS) IMPLANT
PAD DRESSING TELFA 3X8 NADH (GAUZE/BANDAGES/DRESSINGS) IMPLANT
PAD OB MATERNITY 4.3X12.25 (PERSONAL CARE ITEMS) ×2 IMPLANT
RTRCTR C-SECT PINK 25CM LRG (MISCELLANEOUS) ×2 IMPLANT
STAPLER VISISTAT 35W (STAPLE) IMPLANT
SUT MON AB 2-0 CT1 27 (SUTURE) ×2 IMPLANT
SUT MON AB 4-0 PS1 27 (SUTURE) IMPLANT
SUT PDS AB 0 CTX 60 (SUTURE) IMPLANT
SUT PLAIN 2 0 XLH (SUTURE) IMPLANT
SUT VIC AB 0 CTX 36 (SUTURE) ×8
SUT VIC AB 0 CTX36XBRD ANBCTRL (SUTURE) ×4 IMPLANT
SUT VIC AB 4-0 KS 27 (SUTURE) IMPLANT
TOWEL OR 17X24 6PK STRL BLUE (TOWEL DISPOSABLE) ×2 IMPLANT
TRAY FOLEY CATH 14FR (SET/KITS/TRAYS/PACK) ×2 IMPLANT
WATER STERILE IRR 1000ML POUR (IV SOLUTION) ×2 IMPLANT

## 2014-06-10 NOTE — Lactation Note (Signed)
This note was copied from the chart of Samantha Francis. Lactation Consultation Note  Patient Name: Samantha Jazline Cumbee HOZYY'Q Date: 06/10/2014  Baby 5 hours of life. Mom reports attempting to nurse first child a few days due to her own medicals issue. States she had issues with supply then too. Mom able to hand express colostrum. Attempted to latch baby in football position to right breast. Baby wanting to lick and mouth breast. LC placed gloved finger in baby's mouth. Baby able to suckle finger sporadically. Discussed with mom that this is normal esp for first 12 hours. Enc mom to offer lots of STS, nurse with cues, beginning each feed with alternating breasts and give drops of colostrum while baby at breast. Mom given [facility] brochure, aware of OP/BFSG, community resources, and [facility] phone line services.    Maternal Data    Feeding    [facility] Score/Interventions                      Lactation Tools Discussed/Used     Consult Status      Inocente Salles 06/10/2014, 5:41 PM

## 2014-06-10 NOTE — Anesthesia Procedure Notes (Signed)
Spinal  Patient location during procedure: OR Start time: 06/10/2014 11:22 AM Staffing Anesthesiologist: Rudean Curt Performed by: anesthesiologist  Preanesthetic Checklist Completed: patient identified, site marked, surgical consent, pre-op evaluation, timeout performed, IV checked, risks and benefits discussed and monitors and equipment checked Spinal Block Patient position: sitting Prep: DuraPrep Patient monitoring: heart rate, cardiac monitor, continuous pulse ox and blood pressure Approach: midline Location: L3-4 Injection technique: single-shot Needle Needle type: Sprotte  Needle gauge: 24 G Needle length: 9 cm Assessment Sensory level: T4 Additional Notes Patient identified.  Risk benefits discussed including failed block, incomplete pain control, headache, nerve damage, paralysis, blood pressure changes, nausea, vomiting, reactions to medication both toxic or allergic, and postpartum back pain.  Patient expressed understanding and wished to proceed.  All questions were answered.  Sterile technique used throughout procedure.  CSF was clear.  No parasthesia or other complications.  Please see nursing notes for vital signs.

## 2014-06-10 NOTE — Anesthesia Postprocedure Evaluation (Signed)
  Anesthesia Post Note  Patient: Samantha Francis  Procedure(s) Performed: Procedure(s) (LRB): CESAREAN SECTION (N/A)  Anesthesia type: sab   Patient location: PACU  Post pain: Pain level controlled  Post assessment: Post-op Vital signs reviewed  Last Vitals:  Filed Vitals:   06/10/14 1300  BP:   Pulse: 67  Temp:   Resp: 20    Post vital signs: Reviewed  Level of consciousness: sedated  Complications: No apparent anesthesia complications

## 2014-06-10 NOTE — Brief Op Note (Signed)
06/10/2014  12:34 PM  PATIENT:  Samantha Francis  33 y.o. female  PRE-OPERATIVE DIAGNOSIS:  REPEAT cesarean  POST-OPERATIVE DIAGNOSIS:  REPEAT cesarean  PROCEDURE:  Procedure(s): CESAREAN SECTION (N/A)  SURGEON:  Surgeon(s) and Role:    Allyn Kenner, DO - Primary  ANESTHESIA:   spinal  EBL:  Total I/O In: 2300 [I.V.:2300] Out: 800 [Urine:200; Blood:600]  SPECIMEN:  Source of Specimen:  cord blood  DISPOSITION OF SPECIMEN:  N/A  COUNTS:  YES  PLAN OF CARE: Admit to inpatient   PATIENT DISPOSITION:  PACU - hemodynamically stable.   Delay start of Pharmacological VTE agent (>24hrs) due to surgical blood loss or risk of bleeding: not applicable

## 2014-06-10 NOTE — H&P (Signed)
33 y.o. Unknown  G3P1011 comes in for schedule repeat cesarean section.  Otherwise has good fetal movement and no bleeding.  Past Medical History  Diagnosis Date  . Allergic rhinitis   . GERD (gastroesophageal reflux disease)     with pregnancy  . Asthma     childhood but never used an inhaler  . Anemia     not with current pregnancy  . History of uterine fibroid   . HSV (herpes simplex virus) anogenital infection   . PONV (postoperative nausea and vomiting)   . Spinal headache     Past Surgical History  Procedure Laterality Date  . Cesarean section  2009  . Myomectomy      OB History  Gravida Para Term Preterm AB SAB TAB Ectopic Multiple Living  3 1 1  1 1    1     # Outcome Date GA Lbr Len/2nd Weight Sex Delivery Anes PTL Lv  3 CUR           2 SAB           1 TRM               History   Social History  . Marital Status: Married    Spouse Name: N/A    Number of Children: N/A  . Years of Education: N/A   Occupational History  . community support specialist    Social History Main Topics  . Smoking status: Never Smoker   . Smokeless tobacco: Not on file  . Alcohol Use: No  . Drug Use: No  . Sexual Activity: Not on file   Other Topics Concern  . Not on file   Social History Narrative  . No narrative on file   Review of patient's allergies indicates no known allergies.    Prenatal Transfer Tool  Maternal Diabetes: No Genetic Screening: Normal Maternal Ultrasounds/Referrals: Normal Fetal Ultrasounds or other Referrals:  None Maternal Substance Abuse:  No Significant Maternal Medications:  None Significant Maternal Lab Results: Lab values include: Group B Strep negative  Other PNC: uncomplicated.    Filed Vitals:   06/10/14 0948  BP: 129/88  Pulse: 92  Temp: 98.6 F (37 C)  Resp: 20     Lungs/Cor:  NAD Abdomen:  soft, gravid Ex:  no cords, erythema SVE:  deferred FHTs:  Not yet on monitor at time of note    A/P  Admit for schedule repeat  c/s  GBS neg  Ancef 2g preop  Other routine pre-op care  [place], Casimer Bilis

## 2014-06-10 NOTE — Anesthesia Preprocedure Evaluation (Signed)
Anesthesia Evaluation  Patient identified by MRN, date of birth, ID band Patient awake    Reviewed: Allergy & Precautions, H&P , NPO status , Patient's Chart, lab work & pertinent test results  History of Anesthesia Complications (+) PONV, POST - OP SPINAL HEADACHE and history of anesthetic complications  Airway Mallampati: II      Dental   Pulmonary asthma ,  breath sounds clear to auscultation        Cardiovascular Exercise Tolerance: Good Rhythm:regular Rate:Normal     Neuro/Psych  Headaches,    GI/Hepatic GERD-  ,  Endo/Other    Renal/GU      Musculoskeletal   Abdominal   Peds  Hematology  (+) anemia ,   Anesthesia Other Findings   Reproductive/Obstetrics (+) Pregnancy                           Anesthesia Physical Anesthesia Plan  ASA: II  Anesthesia Plan: Spinal   Post-op Pain Management:    Induction:   Airway Management Planned:   Additional Equipment:   Intra-op Plan:   Post-operative Plan:   Informed Consent: I have reviewed the patients History and Physical, chart, labs and discussed the procedure including the risks, benefits and alternatives for the proposed anesthesia with the patient or authorized representative who has indicated his/her understanding and acceptance.     Plan Discussed with: Anesthesiologist, CRNA and Surgeon  Anesthesia Plan Comments:         Anesthesia Quick Evaluation

## 2014-06-10 NOTE — Transfer of Care (Signed)
Immediate Anesthesia Transfer of Care Note  Patient: Samantha Francis  Procedure(s) Performed: Procedure(s): CESAREAN SECTION (N/A)  Patient Location: PACU  Anesthesia Type:Spinal  Level of Consciousness: awake, alert  and oriented  Airway & Oxygen Therapy: Patient Spontanous Breathing  Post-op Assessment: Report given to PACU RN and Post -op Vital signs reviewed and stable  Post vital signs: Reviewed and stable  Complications: No apparent anesthesia complications

## 2014-06-11 ENCOUNTER — Encounter (HOSPITAL_COMMUNITY): Payer: Self-pay | Admitting: Obstetrics and Gynecology

## 2014-06-11 LAB — BIRTH TISSUE RECOVERY COLLECTION (PLACENTA DONATION)

## 2014-06-11 LAB — CBC
HCT: 30.3 % — ABNORMAL LOW (ref 36.0–46.0)
Hemoglobin: 10.2 g/dL — ABNORMAL LOW (ref 12.0–15.0)
MCH: 31.6 pg (ref 26.0–34.0)
MCHC: 33.7 g/dL (ref 30.0–36.0)
MCV: 93.8 fL (ref 78.0–100.0)
PLATELETS: 122 10*3/uL — AB (ref 150–400)
RBC: 3.23 MIL/uL — ABNORMAL LOW (ref 3.87–5.11)
RDW: 14.3 % (ref 11.5–15.5)
WBC: 6.2 10*3/uL (ref 4.0–10.5)

## 2014-06-11 NOTE — Op Note (Signed)
NAME:  Samantha Francis, Samantha Francis NO.:  [id number]  MEDICAL RECORD NO.:  [id number]  LOCATION:  9101                          FACILITY:  [facility]  PHYSICIAN:  Allyn Kenner, DO    DATE OF BIRTH:  [birth year]  DATE OF PROCEDURE:  06/10/2014 DATE OF DISCHARGE:                              OPERATIVE REPORT   PREOPERATIVE DIAGNOSIS:  Repeat cesarean section.  POSTOPERATIVE DIAGNOSIS:  Repeat cesarean section.  PROCEDURE:  Low-transverse cesarean section.  SURGEON:  Allyn Kenner, DO.  ASSISTANT:  Delene Loll, MD  ANESTHESIA:  Spinal.  IV FLUIDS:  2300 mL.  URINE OUTPUT:  200 mL.  ESTIMATED BLOOD LOSS:  600 mL.  COMPLICATIONS:  None.  FINDINGS:  Female infant in cephalic presentation with weight 6 pounds 6 ounces and Apgars 8 and 8.  DESCRIPTION OF PROCEDURE:  The patient was taken to the operating room, where spinal anesthesia was administered and found to be adequate.  She was prepped and draped in normal sterile fashion in dorsal supine position with a leftward tilt.  A Pfannenstiel skin incision was made with a scalpel and carried down to the underlying layer of fascia with Bovie cautery.  The fascia was incised with a scalpel and extended laterally with Mayo scissors.  The superior aspect of the fascial incision was grasped with Kocher clamps, elevated, and rectus muscles were dissected off bluntly and sharply.  Kocher clamps were then placed at the inferior aspect of the fascial incision and again rectus muscles were dissected off bluntly and sharply.  Hemostat was used to separate rectus muscles at the midline.  Thick scar tissue was noted and blunt dissection was used to further develop this separation.  Sharp dissection with Mayo scissors were used to lyse scar tissue.  Peritoneum was involved with scar tissue and the peritoneum was also entered bluntly.  This was extended by lateral traction and the abdomen was then surveyed manually and thick adhesions  were noted involving the lower uterine segment and the vesicouterine peritoneum.  An Alexis self retractor was placed and the vesicouterine peritoneum was tented and entered sharply and extended laterally with Metzenbaum scissors.  The bladder flap was elevated with hemostats and developed visually.  The scalpel was then used to make a low transverse cesarean incision and the amniotic sac was visualized and entered bluntly with an Allis clamp. The incision was extended by manual traction caudally and cephalically. The infant's head was elevated and delivered without difficulty.  The cord was clamped and cut and the infant was handed off to awaiting neonatology.  The placenta was removed by gentle traction on the umbilical cord and external massage of the uterus.  Pelvic clamps were used to further tease the remaining amniotic membranes in the lower uterine segment.  The uterus was cleared of all clot and debris and the uterine incision was closed with 0 Vicryl in a running, locked fashion with a second layer of horizontal Lembert imbrication.  The incision was found to be excellently hemostatic.  An Alexis self retractor was removed.  Additional clot and debris and bilateral gutters were removed and the incision was reexamined and found to be hemostatic.  The peritoneum was reapproximated and closed with 2-0 Monocryl in a  running fashion.  The fascia was then reapproximated and closed with loop PDS in a running fashion.  Subcutaneous tissue was irrigated, dried, and minimal use of Bovie cautery was needed.  The skin was reapproximated and closed with staples.  The patient tolerated the procedure well. Sponge, lap, and needle counts were correct x2.  The patient was taken to recovery in stable condition.          ______________________________ Allyn Kenner, DO     Chaves/MEDQ  D:  06/11/2014  T:  06/11/2014  Job:  [id number]

## 2014-06-11 NOTE — Progress Notes (Signed)
Subjective: Postpartum Day 1: Cesarean Delivery Patient reports adequate pain control. She denies nausea or vomiting. She is breast-feeding, however the baby is not feeding well.   Objective: Vital signs in last 24 hours: Temp:  [97.4 F (36.3 C)-98.3 F (36.8 C)] 98 F (36.7 C) (10/14 0400) Pulse Rate:  [59-91] 78 (10/14 0400) Resp:  [16-22] 18 (10/14 0400) BP: (92-123)/(55-72) 92/58 mmHg (10/14 0400) SpO2:  [95 %-100 %] 97 % (10/14 0400) Weight:  [86.183 kg (190 lb)] 86.183 kg (190 lb) (10/13 1423)  Physical Exam:  General: alert, cooperative and appears stated age Lochia: appropriate Uterine Fundus: firm Incision: healing well DVT Evaluation: No evidence of DVT seen on physical exam.   Recent Labs  06/09/14 1010 06/11/14 0631  HGB 11.6* 10.2*  HCT 34.4* 30.3*    Assessment/Plan: Status post Cesarean section. Doing well postoperatively.  Continue current care. Desires neonatal circumcision, R/B/A of procedure discussed at length. Pt understands that neonatal circumcision is not considered medically necessary and is elective. The risks include, but are not limited to bleeding, infection, damage to the penis, development of scar tissue, and having to have it redone at a later date. Pt understands theses risks and wishes to proceed. Given the infant's poor feeding will likely need to delay circumcision until tomorrow  Samantha Francis H. 06/11/2014, 11:38 AM

## 2014-06-11 NOTE — Anesthesia Postprocedure Evaluation (Signed)
  Anesthesia Post-op Note  Patient: Samantha Francis  Procedure(s) Performed: Procedure(s): CESAREAN SECTION (N/A)  Patient Location: Mother/Baby  Anesthesia Type:Spinal  Level of Consciousness: awake, alert , oriented and patient cooperative  Airway and Oxygen Therapy: Patient Spontanous Breathing  Post-op Pain: mild  Post-op Assessment: Patient's Cardiovascular Status Stable, Respiratory Function Stable, No headache, No backache, No residual numbness and No residual motor weakness  Post-op Vital Signs: stable  Last Vitals:  Filed Vitals:   06/11/14 0400  BP: 92/58  Pulse: 78  Temp: 36.7 C  Resp: 18    Complications: No apparent anesthesia complications

## 2014-06-11 NOTE — Lactation Note (Signed)
This note was copied from the chart of Samantha Francis. Lactation Consultation Note     Follow up consult with this mom and baby, now 44 hours old. Mom pumped for 15 minutes, still no hand exp colostrum from left breast, and bloody colostrum drops from right. i bottle fed baby Preg 20 cla, offered 20 mls, and he took 4 ml in about 10 minutes. Mom set up with every 3 hours feeding plan - try to latch, pump and bottle feed or cup feed EBM and then formula.   Patient Name: Samantha Hedaya Latendresse [id number] Date: 06/11/2014 Reason for consult: Follow-up assessment   Maternal Data    Feeding Feeding Type: Formula Length of feed: 10 min  LATCH Score/Interventions Latch: Repeated attempts needed to sustain latch, nipple held in mouth throughout feeding, stimulation needed to elicit sucking reflex. (baby does not open wide, he did flange and latch a few times, but does not suck 24 nipple shiled tried, but lab came in to drow baby;s bili and NBSCN) Intervention(s): Skin to skin;Teach feeding cues;Waking techniques Intervention(s): Adjust position;Assist with latch;Breast massage;Breast compression  Audible Swallowing: None Intervention(s): Skin to skin;Hand expression  Type of Nipple: Flat Intervention(s): Double electric pump  Comfort (Breast/Nipple): Soft / non-tender     Hold (Positioning): Assistance needed to correctly position infant at breast and maintain latch. Intervention(s): Breastfeeding basics reviewed;Support Pillows;Position options;Skin to skin  LATCH Score: 5  Lactation Tools Discussed/Used Pump Review: Setup, frequency, and cleaning;Milk Storage;Other (comment) (premie setting, hand expression) Initiated by:: clee rn lc Date initiated:: 06/11/14   Consult Status Consult Status: Follow-up Date: 06/12/14 Follow-up type: In-patient    Tonna Corner 06/11/2014, 2:35 PM

## 2014-06-11 NOTE — Addendum Note (Signed)
Addendum created 06/11/14 1253 by Georgeanne Nim, CRNA   Modules edited: Notes Section   Notes Section:  File: [id number]

## 2014-06-12 MED ORDER — MAGNESIUM HYDROXIDE 400 MG/5ML PO SUSP
30.0000 mL | Freq: Once | ORAL | Status: DC
  Filled 2014-06-12: qty 30

## 2014-06-12 MED ORDER — BISACODYL 10 MG RE SUPP
10.0000 mg | Freq: Once | RECTAL | Status: AC
  Administered 2014-06-12: 10 mg via RECTAL
  Filled 2014-06-12: qty 1

## 2014-06-12 NOTE — Lactation Note (Signed)
This note was copied from the chart of Samantha Rafia Ingram-Morning. Lactation Consultation Note Mom hoping to go home today, baby not taking pregestimil in bottle well. Gagging and wouldn't eat. Changed to similac and is drinking well. Mom has DEBP at bedside.  Patient Name: Samantha Francis [id number] Date: 06/12/2014 Reason for consult: Follow-up assessment;Infant weight loss   Maternal Data    Feeding Feeding Type: Formula Nipple Type: Slow - flow  LATCH Score/Interventions                      Lactation Tools Discussed/Used     Consult Status Consult Status: Follow-up Date: 06/12/14 Follow-up type: In-patient    Chad Tiznado, [place]ta [place] 06/12/2014, 3:05 AM

## 2014-06-12 NOTE — Progress Notes (Signed)
Subjective: Postpartum Day 2: Cesarean Delivery Patient reports incisional pain, tolerating PO and no problems voiding.   Patient c/o feeling distended and bloated. Hasn't passed flatus yet.  She denies nausea or vomiting  Objective: Vital signs in last 24 hours: Temp:  [97.9 F (36.6 C)] 97.9 F (36.6 C) (10/15 0557) Pulse Rate:  [87] 87 (10/15 0557) Resp:  [18] 18 (10/15 0557) BP: (113)/(61) 113/61 mmHg (10/15 0557)  Physical Exam:  General: alert, cooperative and no distress Lochia: appropriate Uterine Fundus: firm Incision: healing well, no significant drainage, no dehiscence, no significant erythema DVT Evaluation: No evidence of DVT seen on physical exam. Negative Homan's sign. No cords or calf tenderness.   Recent Labs  06/09/14 1010 06/11/14 0631  HGB 11.6* 10.2*  HCT 34.4* 30.3*    Assessment/Plan: Status post Cesarean section. Doing well postoperatively.  Dulcolax suppository. Encouraged to ambulate  Evellyn Tuff, [place] 06/12/2014, 9:07 AM

## 2014-06-12 NOTE — Lactation Note (Addendum)
This note was copied from the chart of Samantha Francis. Lactation Consultation Note  Patient Name: Samantha Adaleah Forget CLEXN'T Date: 06/12/2014 Reason for consult: Follow-up assessment Per mom still having challenges with latch , mostly bottles. Baby awake and mom ok with trying to latch. LC noted swollen areolas  Applied a #24 Nipple shield, with formula and syringe , fed 10 mins on and off for  10 ml of formula. Per mom haven't had time to pump this am. LC reviewed the importance of consistent  Pumping to establish and protect milk supply. Mom already has a DEBP set up . Mom and dad aware of the Baby's nutritional needs.  Per mom has a DEBP at home    Maternal Data    Feeding Feeding Type: Formula Nipple Type: Slow - flow Length of feed: 10 min (on and off pattern with NS )  LATCH Score/Interventions Latch: Repeated attempts needed to sustain latch, nipple held in mouth throughout feeding, stimulation needed to elicit sucking reflex. (on and off pattern ) Intervention(s): Skin to skin;Teach feeding cues;Waking techniques Intervention(s): Adjust position;Assist with latch;Breast massage;Breast compression  Audible Swallowing: Spontaneous and intermittent (with NS and formula )  Type of Nipple: Everted at rest and after stimulation (swollen areolas )  Comfort (Breast/Nipple): Soft / non-tender     Hold (Positioning): Assistance needed to correctly position infant at breast and maintain latch. Intervention(s): Breastfeeding basics reviewed;Support Pillows;Position options;Skin to skin  LATCH Score: 8  Lactation Tools Discussed/Used Tools: Nipple Shields Nipple shield size: 24   Consult Status Consult Status: Follow-up Date: 06/13/14 Follow-up type: In-patient    Myer Haff 06/12/2014, 12:18 PM

## 2014-06-13 MED ORDER — OXYCODONE-ACETAMINOPHEN 5-325 MG PO TABS: 2.0000 | ORAL_TABLET | ORAL | Status: DC | PRN

## 2014-06-13 NOTE — Discharge Summary (Signed)
Obstetric Discharge Summary Reason for Admission: cesarean section Prenatal Procedures: ultrasound Intrapartum Procedures: cesarean: low cervical, transverse Postpartum Procedures: none Complications-Operative and Postpartum: none Hemoglobin  Date Value Ref Range Status  06/11/2014 10.2* 12.0 - 15.0 g/dL Final     HCT  Date Value Ref Range Status  06/11/2014 30.3* 36.0 - 46.0 % Final    Physical Exam:  General: alert and cooperative Lochia: appropriate Uterine Fundus: firm Incision: healing well, no significant drainage, no significant erythema DVT Evaluation: No evidence of DVT seen on physical exam.  Discharge Diagnoses: Term Pregnancy-delivered  Discharge Information: Date: 06/13/2014 Activity: pelvic rest Diet: routine Medications: PNV, Ibuprofen and Percocet Condition: stable Instructions: refer to practice specific booklet Discharge to: home Follow-up Information   Follow up with Madisen Ludvigsen, DO In 2 weeks.   Specialty:  Obstetrics and Gynecology   Contact information:   [address] [place] [address] [phone]       Newborn Data: Live born female  Birth Weight: 6 lb 6.1 oz (2895 g) APGAR: 8, 8  Home with mother.  Allyn Kenner 06/13/2014, 10:21 AM

## 2014-06-13 NOTE — Lactation Note (Signed)
This note was copied from the chart of Samantha Francis. Lactation Consultation Note Attempted to latch baby, baby sleepy and did not wake for a feeding.  Mom does want to continue breastfeeding as much as she is able.  Mom has a pump at home. Reviewed prevention and treatment of engorgement. Enc mom to continue frequent STS and cue based feeding, and to pump every time baby has a bottle of formula.  O/P appt made for next Tuesday, 0900.   Mom was encouraged to call lactation department if she has any questions or concerns and to attend the breastfeeding support group.   Patient Name: Samantha Margarine Grosshans [id number] Date: 06/13/2014 Reason for consult: Follow-up assessment   Maternal Data    Feeding    LATCH Score/Interventions Latch: Too sleepy or reluctant, no latch achieved, no sucking elicited.                    Lactation Tools Discussed/Used     Consult Status Consult Status: Follow-up Date: 06/17/14 Follow-up type: Out-patient    [place] Shi [facility] 06/13/2014, 10:25 AM

## 2014-06-13 NOTE — Progress Notes (Signed)
Patient is eating, ambulating, voiding.  Pain control is good.  Appropriate lochia.  No complaints.  Filed Vitals:   06/11/14 0000 06/11/14 0400 06/12/14 0557 06/13/14 0559  BP: 102/60 92/58 113/61 128/62  Pulse: 91 78 87 78  Temp: 98 F (36.7 C) 98 F (36.7 C) 97.9 F (36.6 C) 98.2 F (36.8 C)  TempSrc: Oral Oral Oral Oral  Resp: 18 18 18    Height:      Weight:      SpO2: 97% 97%  100%    Fundus firm Perineum without swelling. Inc: c/d/i Ext: no CT  Lab Results  Component Value Date   WBC 6.2 06/11/2014   HGB 10.2* 06/11/2014   HCT 30.3* 06/11/2014   MCV 93.8 06/11/2014   PLT 122* 06/11/2014    --/--/O POS, O POS (10/12 1010)  A/P Post op day #3 s/p Repeat C/S.  Routine care.    Allyn Kenner

## 2014-06-17 ENCOUNTER — Inpatient Hospital Stay (HOSPITAL_COMMUNITY): Admit: 2014-06-17 | Payer: No Typology Code available for payment source

## 2014-06-30 ENCOUNTER — Encounter (HOSPITAL_COMMUNITY): Payer: Self-pay | Admitting: Obstetrics and Gynecology

## 2014-07-12 ENCOUNTER — Inpatient Hospital Stay (HOSPITAL_COMMUNITY)
Admission: AD | Admit: 2014-07-12 | Discharge: 2014-07-12 | Disposition: A | Discharge status: Home / Self Care | Payer: No Typology Code available for payment source | Source: Ambulatory Visit | Attending: Obstetrics | Admitting: Obstetrics

## 2014-07-12 ENCOUNTER — Encounter (HOSPITAL_COMMUNITY): Payer: Self-pay | Admitting: *Deleted

## 2014-07-12 ENCOUNTER — Inpatient Hospital Stay (HOSPITAL_COMMUNITY): Payer: No Typology Code available for payment source

## 2014-07-12 DIAGNOSIS — N939 Abnormal uterine and vaginal bleeding, unspecified: Secondary | ICD-10-CM

## 2014-07-12 LAB — CBC
HCT: 34.8 % — ABNORMAL LOW (ref 36.0–46.0)
Hemoglobin: 11.6 g/dL — ABNORMAL LOW (ref 12.0–15.0)
MCH: 30.9 pg (ref 26.0–34.0)
MCHC: 33.3 g/dL (ref 30.0–36.0)
MCV: 92.6 fL (ref 78.0–100.0)
PLATELETS: 175 10*3/uL (ref 150–400)
RBC: 3.76 MIL/uL — ABNORMAL LOW (ref 3.87–5.11)
RDW: 13 % (ref 11.5–15.5)
WBC: 5.7 10*3/uL (ref 4.0–10.5)

## 2014-07-12 LAB — TYPE AND SCREEN
ABO/RH(D): O POS
Antibody Screen: NEGATIVE

## 2014-07-12 NOTE — Discharge Instructions (Signed)
Abnormal Uterine Bleeding Abnormal uterine bleeding can affect women at various stages in life, including teenagers, women in their reproductive years, pregnant women, and women who have reached menopause. Several kinds of uterine bleeding are considered abnormal, including:  Bleeding or spotting between periods.   Bleeding after sexual intercourse.   Bleeding that is heavier or more than normal.   Periods that last longer than usual.  Bleeding after menopause.  Many cases of abnormal uterine bleeding are minor and simple to treat, while others are more serious. Any type of abnormal bleeding should be evaluated by your health care provider. Treatment will depend on the cause of the bleeding. HOME CARE INSTRUCTIONS Monitor your condition for any changes. The following actions may help to alleviate any discomfort you are experiencing:  Avoid the use of tampons and douches as directed by your health care provider.  Change your pads frequently. You should get regular pelvic exams and Pap tests. Keep all follow-up appointments for diagnostic tests as directed by your health care provider.  SEEK MEDICAL CARE IF:   Your bleeding lasts more than 1 week.   You feel dizzy at times.  SEEK IMMEDIATE MEDICAL CARE IF:   You pass out.   You are changing pads every 15 to 30 minutes.   You have abdominal pain.  You have a fever.   You become sweaty or weak.   You are passing large blood clots from the vagina.   You start to feel nauseous and vomit. MAKE SURE YOU:   Understand these instructions.  Will watch your condition.  Will get help right away if you are not doing well or get worse. Document Released: 08/15/2005 Document Revised: 08/20/2013 Document Reviewed: 03/14/2013 ExitCare Patient Information 2015 [employer]. This information is not intended to replace advice given to you by your health care provider. Make sure you discuss any questions you have with your  health care provider.  

## 2014-07-12 NOTE — MAU Note (Signed)
Had repeat C/S 10/13. Had normal vag bleeding afterward. Stopped bleeding a week ago last Tues. My period started this past Tues. And was heavy. Hx fibroids but had fibroids removed. Using tampon and pad every 66mins. No pain

## 2014-07-12 NOTE — Progress Notes (Signed)
Marie Williams CNM in earlier to discuss test results and d/c plan. Written and verbal d/c instructions given and understanding voiced °

## 2014-07-12 NOTE — MAU Provider Note (Signed)
History     CSN: [id number]  Arrival date and time: 07/12/14 0005   First Provider Initiated Contact with Patient 07/12/14 0122      Chief Complaint  Patient presents with  . Vaginal Bleeding   HPI This is a 33 y.o. female who is one month post C/S who presents with c/o heavy vaginal bleeding. States it started 2 days ago but got very heavy today. Soaked through a tampon and maxipad.  No cramping.  RN Note:  Had repeat C/S 10/13. Had normal vag bleeding afterward. Stopped bleeding a week ago last Tues. My period started this past Tues. And was heavy. Hx fibroids but had fibroids removed. Using tampon and pad every 31mins. No pain          OB History    Gravida Para Term Preterm AB TAB SAB Ectopic Multiple Living   3 2 1  1  1   2       Past Medical History  Diagnosis Date  . Allergic rhinitis   . GERD (gastroesophageal reflux disease)     with pregnancy  . Asthma     childhood but never used an inhaler  . Anemia     not with current pregnancy  . History of uterine fibroid   . HSV (herpes simplex virus) anogenital infection   . PONV (postoperative nausea and vomiting)   . Spinal headache     Past Surgical History  Procedure Laterality Date  . Cesarean section  2009  . Myomectomy    . Cesarean section N/A 06/10/2014    Procedure: CESAREAN SECTION;  Surgeon: Allyn Kenner, DO;  Location: [facility];  Service: Obstetrics;  Laterality: N/A;    Family History  Problem Relation Age of Onset  . Hyperlipidemia Mother     History  Substance Use Topics  . Smoking status: Never Smoker   . Smokeless tobacco: Not on file  . Alcohol Use: No    Allergies: No Known Allergies  Prescriptions prior to admission  Medication Sig Dispense Refill Last Dose  . Prenatal Vit-Fe Fumarate-FA (MULTIVITAMIN-PRENATAL) 27-0.8 MG TABS tablet Take 1 tablet by mouth daily at 12 noon.   07/11/2014 at Unknown time  . Ca Carbonate-Mag Hydroxide (ROLAIDS PO) Take by mouth.   Past Week  at Unknown time  . oxyCODONE-acetaminophen (PERCOCET/ROXICET) 5-325 MG per tablet Take 2 tablets by mouth every 4 (four) hours as needed (for pain scale equal to or greater than 7). 30 tablet 0     Review of Systems  Constitutional: Negative for fever, chills and malaise/fatigue.  Gastrointestinal: Negative for nausea, vomiting and abdominal pain.  Genitourinary:       Heavy vaginal bleeding   Neurological: Negative for headaches.   Physical Exam   Blood pressure 122/76, pulse 78, temperature 98.4 F (36.9 C), resp. rate 18, height 5' 2.2" (1.58 m), weight 179 lb 6.4 oz (81.375 kg), unknown if currently breastfeeding.  Physical Exam  Constitutional: She is oriented to person, place, and time. She appears well-developed and well-nourished. No distress.  HENT:  Head: Normocephalic.  Cardiovascular: Normal rate.   Respiratory: Effort normal.  GI: Soft. She exhibits no distension and no mass. There is tenderness (uterus). There is no rebound and no guarding.  C/S scar well healed   Genitourinary: Vaginal discharge (moderately heavy bleeding, pool of dark blood invault.) found.  Musculoskeletal: Normal range of motion.  Neurological: She is alert and oriented to person, place, and time.  Skin: Skin is warm and dry.  Psychiatric: She has a normal mood and affect.    MAU Course  Procedures  MDM Results for orders placed or performed during the hospital encounter of 07/12/14 (from the past 24 hour(s))  CBC     Status: Abnormal   Collection Time: 07/12/14 12:35 AM  Result Value Ref Range   WBC 5.7 4.0 - 10.5 K/uL   RBC 3.76 (L) 3.87 - 5.11 MIL/uL   Hemoglobin 11.6 (L) 12.0 - 15.0 g/dL   HCT 34.8 (L) 36.0 - 46.0 %   MCV 92.6 78.0 - 100.0 fL   MCH 30.9 26.0 - 34.0 pg   MCHC 33.3 30.0 - 36.0 g/dL   RDW 13.0 11.5 - 15.5 %   Platelets 175 150 - 400 K/uL  Type and screen     Status: None (Preliminary result)   Collection Time: 07/12/14 12:35 AM  Result Value Ref Range    ABO/RH(D) O POS    Antibody Screen PENDING    Sample Expiration 07/15/2014     US Transvaginal Non-ob  07/12/2014   CLINICAL DATA:  Heavy vaginal bleeding  EXAM: TRANSABDOMINAL AND TRANSVAGINAL ULTRASOUND OF PELVIS  TECHNIQUE: Both transabdominal and transvaginal ultrasound examinations of the pelvis were performed. Transabdominal technique was performed for global imaging of the pelvis including uterus, ovaries, adnexal regions, and pelvic cul-de-sac. It was necessary to proceed with endovaginal exam following the transabdominal exam to visualize the endometrium.  COMPARISON:  None  FINDINGS: Uterus  Measurements: 12.1 x 6.8 x 8 cm. There is a 1.5 x 1.3 x 1 cm hypoechoic posterior urine mass most consistent with a fibroid. There is cesarean section postsurgical change to the left of midline incidentally noted.  Endometrium  Thickness: 7.6 mm.  No focal abnormality visualized.  Right ovary  Measurements: 3.3 x 1.5 x 1.5 cm. Normal appearance/no adnexal mass.  Left ovary  Measurements: 2.5 x 1.4 x 1.7 cm. Normal appearance/no adnexal mass.  Other findings  No free fluid.  IMPRESSION: 1. There are no findings to explain the patient's uterine bleeding. If bleeding remains unresponsive to hormonal or medical therapy, sonohysterogram should be considered for focal lesion work-up. (Ref: Radiological Reasoning: Algorithmic Workup of Abnormal Vaginal Bleeding with Endovaginal Sonography and Sonohysterography. AJR 2008; 119:J47-82) 2. Small uterine fibroid.   Electronically Signed   By: Kathreen Devoid   On: 07/12/2014 03:06   US Pelvis Complete  07/12/2014   CLINICAL DATA:  Heavy vaginal bleeding  EXAM: TRANSABDOMINAL AND TRANSVAGINAL ULTRASOUND OF PELVIS  TECHNIQUE: Both transabdominal and transvaginal ultrasound examinations of the pelvis were performed. Transabdominal technique was performed for global imaging of the pelvis including uterus, ovaries, adnexal regions, and pelvic cul-de-sac. It was necessary to  proceed with endovaginal exam following the transabdominal exam to visualize the endometrium.  COMPARISON:  None  FINDINGS: Uterus  Measurements: 12.1 x 6.8 x 8 cm. There is a 1.5 x 1.3 x 1 cm hypoechoic posterior urine mass most consistent with a fibroid. There is cesarean section postsurgical change to the left of midline incidentally noted.  Endometrium  Thickness: 7.6 mm.  No focal abnormality visualized.  Right ovary  Measurements: 3.3 x 1.5 x 1.5 cm. Normal appearance/no adnexal mass.  Left ovary  Measurements: 2.5 x 1.4 x 1.7 cm. Normal appearance/no adnexal mass.  Other findings  No free fluid.  IMPRESSION: 1. There are no findings to explain the patient's uterine bleeding. If bleeding remains unresponsive to hormonal or medical therapy, sonohysterogram should be considered for focal lesion work-up. (Ref: Radiological  Reasoning: Algorithmic Workup of Abnormal Vaginal Bleeding with Endovaginal Sonography and Sonohysterography. AJR 2008; 582:P18-98) 2. Small uterine fibroid.   Electronically Signed   By: Kathreen Devoid   On: 07/12/2014 03:06    Assessment and Plan  A:  Post Cesarean Section with bleeding, probably menses      Improved hemoglobin level since last month  P:  Discussed with Dr Carlis Abbott       Discharge home       Call if bleeding persists in this heavy state  [facility] 07/12/2014, 1:24 AM

## 2015-01-08 ENCOUNTER — Ambulatory Visit (INDEPENDENT_AMBULATORY_CARE_PROVIDER_SITE_OTHER): Payer: PRIVATE HEALTH INSURANCE | Admitting: Emergency Medicine

## 2015-01-08 VITALS — BP 130/72 | HR 97 | Temp 98.4°F | Resp 20 | Ht 62.75 in | Wt 174.5 lb

## 2015-01-08 DIAGNOSIS — L709 Acne, unspecified: Secondary | ICD-10-CM

## 2015-01-08 DIAGNOSIS — L700 Acne vulgaris: Secondary | ICD-10-CM

## 2015-01-08 MED ORDER — NAPROXEN 500 MG PO TABS: 500.0000 mg | ORAL_TABLET | Freq: Two times a day (BID) | ORAL | Status: DC | PRN

## 2015-01-08 MED ORDER — SULFAMETHOXAZOLE-TRIMETHOPRIM 800-160 MG PO TABS: 1.0000 | ORAL_TABLET | Freq: Two times a day (BID) | ORAL | Status: DC

## 2015-01-08 NOTE — Patient Instructions (Signed)

## 2015-01-08 NOTE — Progress Notes (Signed)
  Samantha Francis - 34 y.o. female MRN [id number]  Date of birth: 12/08/1980  SUBJECTIVE: CC: 1.  skin lesion, initial evaluation      HPI:   right maxillary skin lesion that is essentially pimple. Patient popped a pimple 4 days ago and has had progressive worsening swelling, pain, erythema. She has been trying ice, heat and topical antibiotic cream without significant improvement.  No fevers, chills or recent night sweats.  No dental discomfort. Multiple cavities in the past but no difficulty swallowing, chewing or intraoral discomfort.      ROS:  per HPI    HISTORY:  Past Medical, Surgical, Social, and Family History reviewed & updated per EMR.  Pertinent Historical Findings include: Social History   Occupational History  . community support specialist    Social History Main Topics  . Smoking status: Never Smoker   . Smokeless tobacco: Never Used  . Alcohol Use: No  . Drug Use: No  . Sexual Activity: Not Currently     Comment: plans to discuss with provider 11/23 at appt    No specialty comments available. No problems updated.   OBJECTIVE:  VS:   HT:5' 2.75" (159.4 cm)   WT:174 lb 8 oz (79.153 kg)  BMI:31.2          BP:130/72 mmHg  HR:97bpm  TEMP:98.4 F (36.9 C)(Oral)  RESP:100 %  Physical Exam  Constitutional: She is well-developed, well-nourished, and in no distress. No distress.  HENT:  Head: Normocephalic and atraumatic.    No significant submandibular lymph nodes, or anterior cervical lymphadenop  Eyes: Right eye exhibits no discharge. Left eye exhibits no discharge. No scleral icterus.  Pulmonary/Chest: Effort normal. No respiratory distress.  Skin: She is not diaphoretic.  Psychiatric: Mood, memory, affect and judgment normal.     ASSESSMENT & PLAN: See problem based charting & AVS for additional documentation Problem List Items Addressed This Visit    None    Visit Diagnoses    Nodular acne    -  Primary    No evidence of fluctuance on  exam the large area of induration with surrounding erythema. Small pustule      Discussed options for care including superficial I&D but given on the facial region would prefer to avoid this. Also no overt evidence of fluctuance and likely not indicated unless any significant worsening. Would prefer referral to plastic surgery for this if any evidence of worsening in nonresponse antibiotics. Bactrim and Naprosyn, warm compresses when necessary. Follow-up if any worsening.   FOLLOW UP:  Return if symptoms worsen or fail to improve.

## 2015-01-09 NOTE — Progress Notes (Signed)
  Medical screening examination/treatment/procedure(s) were performed by non-physician practitioner and as supervising physician I was immediately available for consultation/collaboration.     

## 2015-01-12 ENCOUNTER — Telehealth: Payer: Self-pay

## 2015-01-12 DIAGNOSIS — L989 Disorder of the skin and subcutaneous tissue, unspecified: Secondary | ICD-10-CM

## 2015-01-12 NOTE — Telephone Encounter (Signed)
Referral placed.

## 2015-01-12 NOTE — Telephone Encounter (Signed)
Please advise, would plastic surgeon be the next step?

## 2015-01-12 NOTE — Telephone Encounter (Signed)
Patient states the area on her face has not improved. She is requesting a referral to a Psychiatric nurse. Patients call back number is [phone]

## 2015-01-12 NOTE — Telephone Encounter (Signed)
Called pt to let her know.

## 2015-01-22 ENCOUNTER — Telehealth: Payer: Self-pay

## 2015-01-22 NOTE — Telephone Encounter (Signed)
Patient called stating she has left numerous messages for the referral dept and no one has called her back. She was seen on May 12 for an abcess on her face and is waiting on a referral to see a Psychologist, sport and exercise. She is frustrated because each time she calls, she can't get anyone on the phone and no one returns her calls. I apologized to her and told her that because referrals staff are gone for the day, I will follow up with them and call her back myself in the morning at [phone].

## 2015-01-23 NOTE — Telephone Encounter (Signed)
Spoke with Graf in referrals. She will work on referral today and call patient around lunch time with an update. Per Loma Sousa, ok to call and let patient know that if she doesn't hear anything by lunchtime to call and ask for [employer]. Spoke with patient at she will wait for Courtney's call. Cb# [phone].

## 2015-02-03 DIAGNOSIS — L723 Sebaceous cyst: Secondary | ICD-10-CM | POA: Insufficient documentation

## 2015-03-15 IMAGING — US US PELVIS COMPLETE
1 series · 13 of 25 positions shown · non-contrast
Comparison: None

CLINICAL DATA: Heavy vaginal bleeding



[Series 1: us pelvis complete · 13 of 59 slices shown]
[im 1/59]
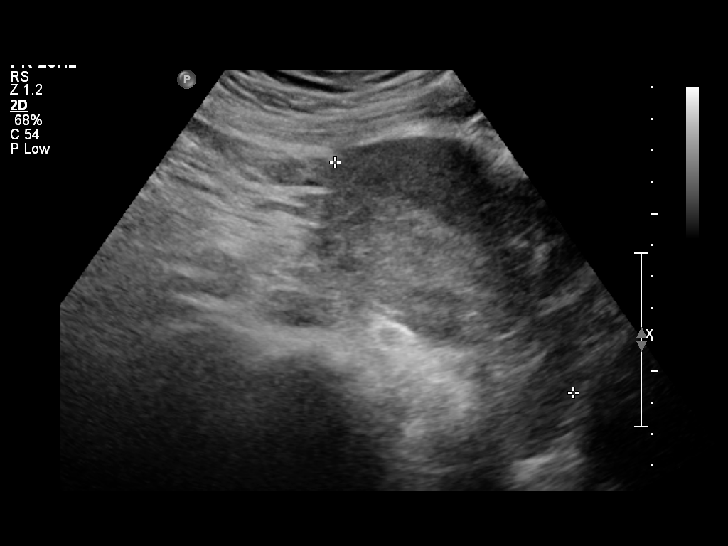
[im 5/59]
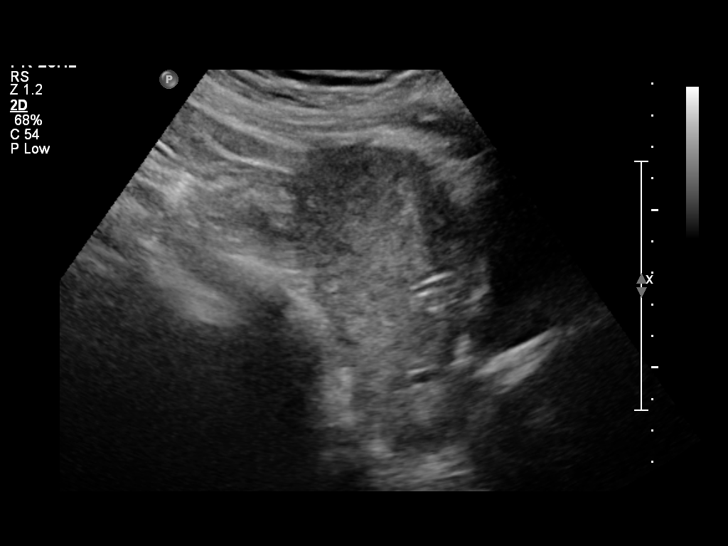
[im 10/59]
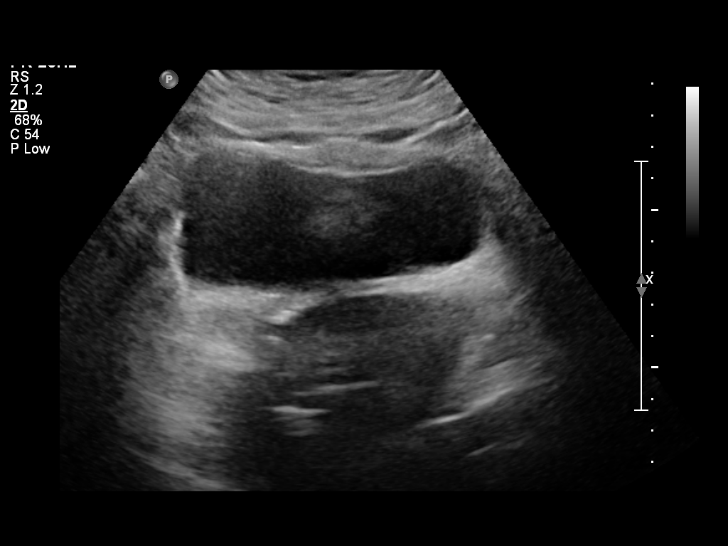
[im 15/59]
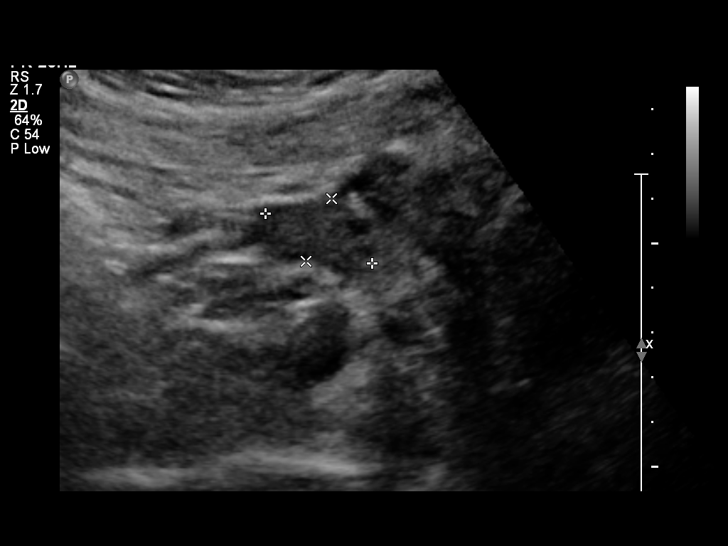
[im 20/59]
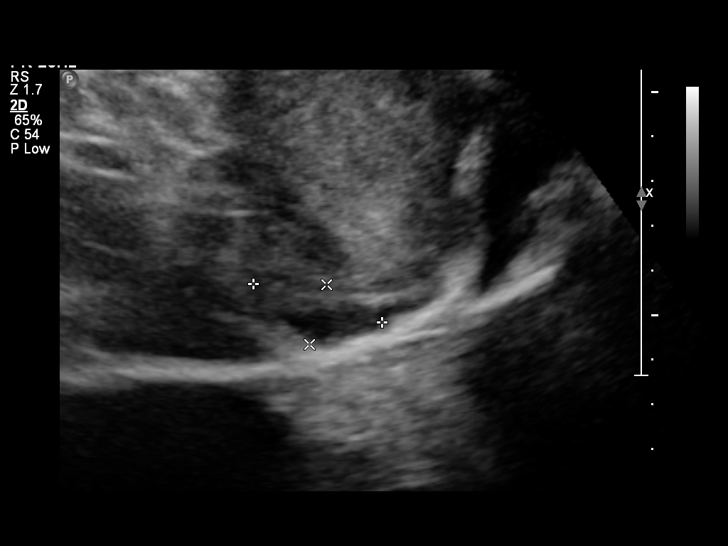
[im 25/59]
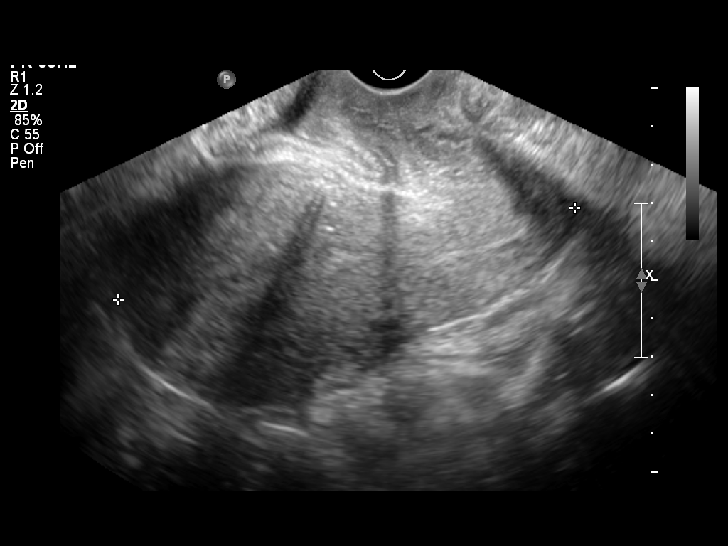
[im 30/59]
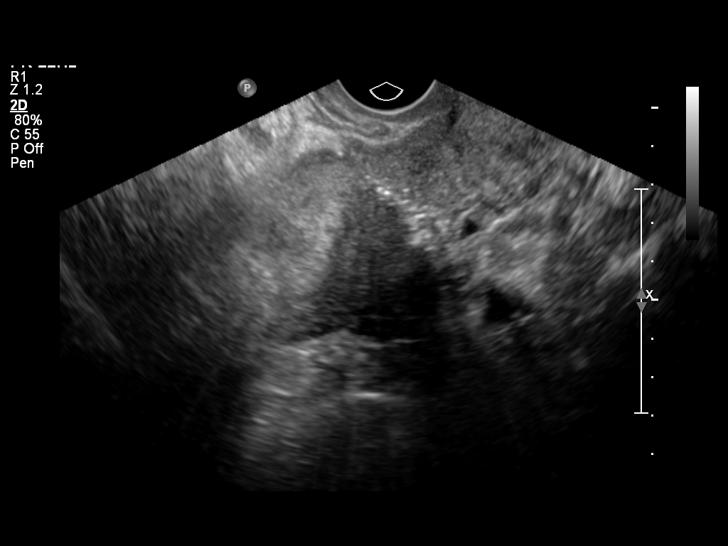
[im 34/59]
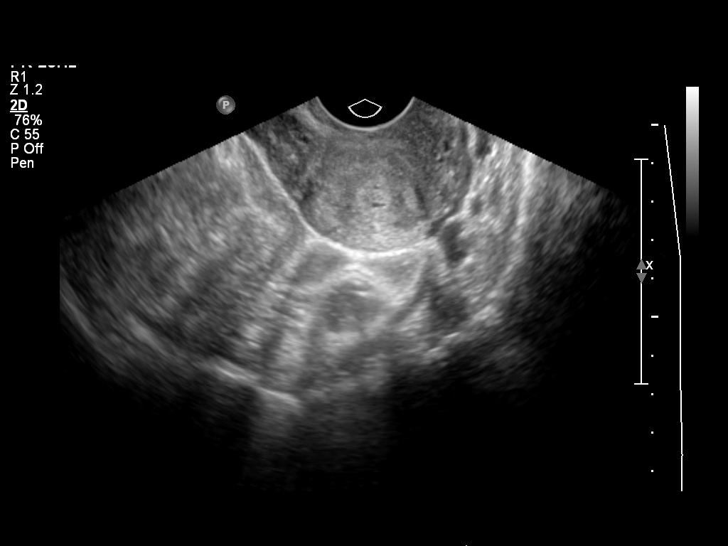
[im 39/59]
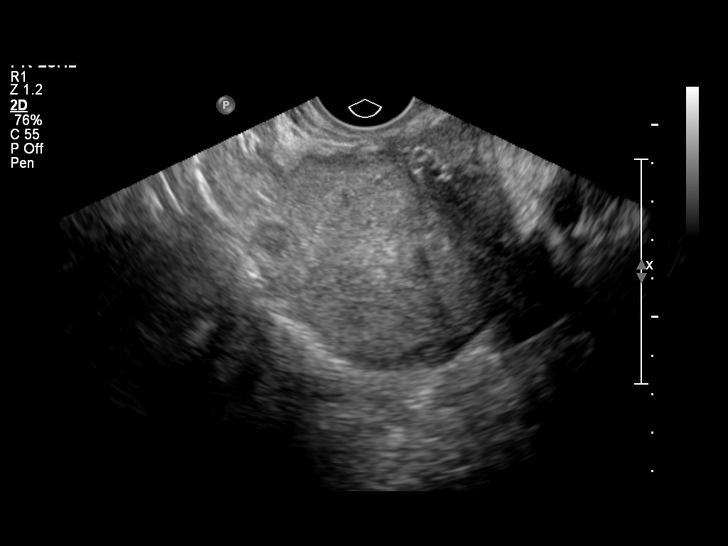
[im 44/59]
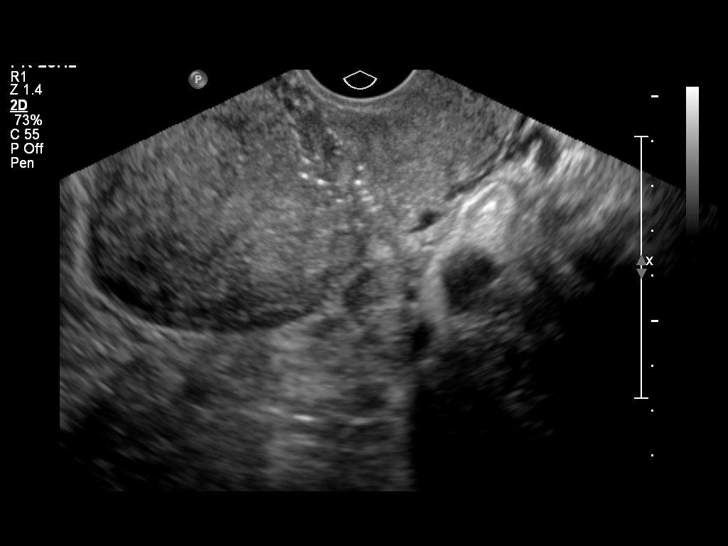
[im 49/59]
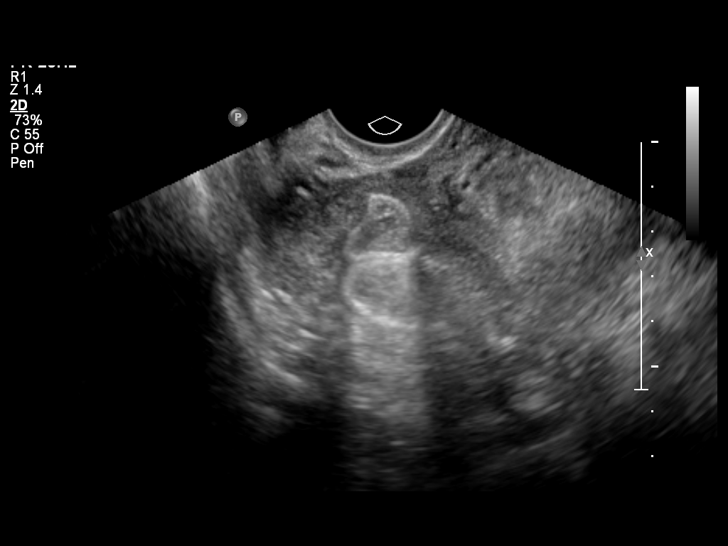
[im 54/59]
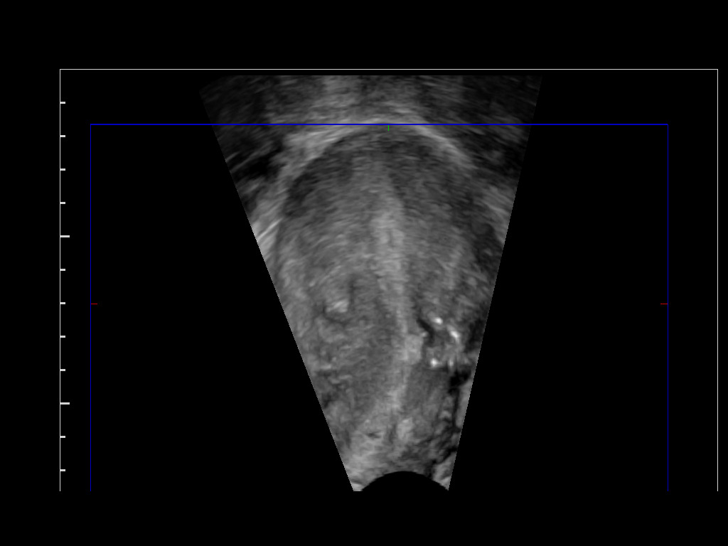
[im 59/59]
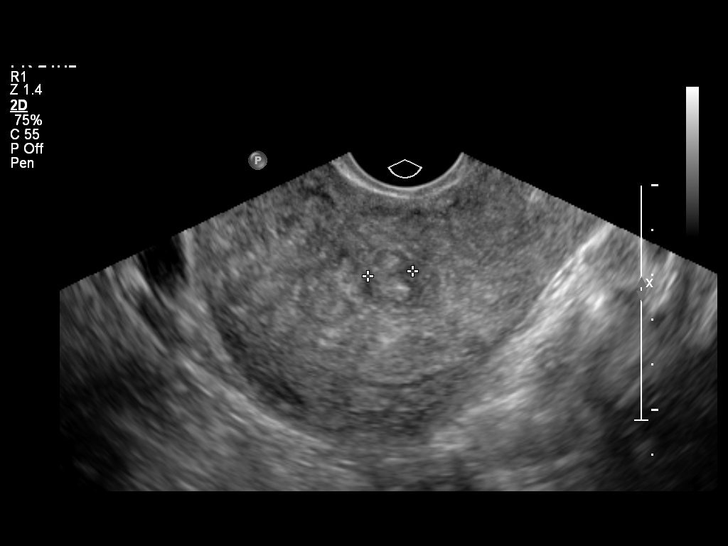

[13 of 25 positions shown; findings below may reference images not displayed]

FINDINGS: Uterus

Measurements: 12.1 x 6.8 x 8 cm. There is a 1.5 x 1.3 x 1 cm
hypoechoic posterior urine mass most consistent with a fibroid.
There is cesarean section postsurgical change to the left of midline
incidentally noted.

Endometrium

Thickness: 7.6 mm.  No focal abnormality visualized.

Right ovary

Measurements: 3.3 x 1.5 x 1.5 cm. Normal appearance/no adnexal mass.

Left ovary

Measurements: 2.5 x 1.4 x 1.7 cm. Normal appearance/no adnexal mass.

Other findings

No free fluid.
IMPRESSION: 1. There are no findings to explain the patient's uterine bleeding.
If bleeding remains unresponsive to hormonal or medical therapy,
sonohysterogram should be considered for focal lesion work-up. (Ref:
Radiological Reasoning: Algorithmic Workup of Abnormal Vaginal
Bleeding with Endovaginal Sonography and Sonohysterography. AJR
1889; 191:S68-73)
2. Small uterine fibroid.

## 2015-11-10 ENCOUNTER — Ambulatory Visit (INDEPENDENT_AMBULATORY_CARE_PROVIDER_SITE_OTHER): Payer: 59 | Admitting: Physician Assistant

## 2015-11-10 VITALS — BP 133/85 | HR 80 | Temp 98.7°F | Resp 16 | Ht 62.0 in | Wt 176.8 lb

## 2015-11-10 DIAGNOSIS — N912 Amenorrhea, unspecified: Secondary | ICD-10-CM

## 2015-11-10 DIAGNOSIS — D1801 Hemangioma of skin and subcutaneous tissue: Secondary | ICD-10-CM

## 2015-11-10 DIAGNOSIS — I781 Nevus, non-neoplastic: Secondary | ICD-10-CM

## 2015-11-10 LAB — POCT URINE PREGNANCY: PREG TEST UR: POSITIVE — AB

## 2015-11-10 NOTE — Patient Instructions (Addendum)
OB/GYN in [facility] [phone] [facility] - [phone] [facility] - [phone]   Pregnancy care help for the uninsured [phone] [facility] [facility] Human services) [email or web address]  Alternatives to pregnancy:  Abortion -   [facility] [phone] - [email or web address]  1 - A [facility] [email or web address]  [facility] - [phone] - [email or web address]  [place] - [phone] - [email or web address]  Cherry Angioma Cherry angiomas are noncancerous (benign) skin growths. They are made up of a clump of blood vessels. They occur most often in people over the age of 46. CAUSES  The cause of these skin growths is unknown, but they appear to run in families. SYMPTOMS  Cherry angiomas are smooth, round, red bumps on the skin. They can be as small as a pinhead or as big as a pencil eraser. The color may darken to a purplish red over time. Cherry angiomas are usually found on the trunk, but they can occur anywhere on the body. They are painless, but they may bleed if they are injured. The bleeding is not serious and will stop when firm pressure is applied. DIAGNOSIS  Your health care provider can usually tell what is wrong by performing a physical exam. A tissue sample (biopsy) may also be taken and examined under a microscope. TREATMENT  Usually no treatment is needed for cherry angiomas. They may be removed for improved appearance (cosmetic) reasons. Sometimes, cherry angiomas come back after removal. Removal methods include:  Electrocautery. Heat is used to burn the growth off the skin.  Cryosurgery. Liquid nitrogen is applied to the growth to freeze it. The growth eventually falls off the skin.  Surgery. HOME CARE INSTRUCTIONS  If your skin was covered with a bandage, change and remove the bandage as directed by your health  care provider.  Check your skin regularly for any changes. SEEK MEDICAL CARE IF: You notice any changes or new growths on your skin.   This information is not intended to replace advice given to you by your health care provider. Make sure you discuss any questions you have with your health care provider.   Document Released: 10/24/2001 Document Revised: 09/05/2014 Document Reviewed: 09/23/2011 Elsevier Interactive Patient Education 2016 [employer].   IF you received an x-ray today, you will receive an invoice from [facility]. Please contact [facility] at [phone] with questions or concerns regarding your invoice.   IF you received labwork today, you will receive an invoice from [employer]. Please contact [facility] at [phone] with questions or concerns regarding your invoice.   Our billing staff will not be able to assist you with questions regarding bills from these companies.  You will be contacted with the lab results as soon as they are available. The fastest way to get your results is to activate your My Chart account. Instructions are located on the last page of this paperwork. If you have not heard from [place] regarding the results in 2 weeks, please contact this office.

## 2015-11-10 NOTE — Progress Notes (Signed)
Samantha Francis  MRN: [id number] DOB: Dec 24, 1980  Subjective:  Pt presents to clinic with a red bump on her left breast.  She is worried about breast cancer because a maternal cousin was diagnosed with not hereditary breast cancer in her 54s.  The patient just noticed this red bump - it does not hurt and she feels nothing in the breast tissue.    She is also under a lot of stress - she states she is about [redacted] weeks pregnant and she is currently separated from her husband and she is not sure what she wants to do with the pregnancy - she has not yet seen an OB because she is not sure she wants to continue the pregnancy.  She is having no problems with the pregnancy - she has an 36 month old at home.  Patient Active Problem List   Diagnosis Date Noted  . Cesarean delivery delivered 06/10/2014  . Cough 11/20/2009    Current Outpatient Prescriptions on File Prior to Visit  Medication Sig Dispense Refill  . Ca Carbonate-Mag Hydroxide (ROLAIDS PO) Take by mouth. Reported on 11/10/2015    . naproxen (NAPROSYN) 500 MG tablet Take 1 tablet (500 mg total) by mouth 2 (two) times daily as needed for mild pain. (Patient not taking: Reported on 11/10/2015) 60 tablet 1  . oxyCODONE-acetaminophen (PERCOCET/ROXICET) 5-325 MG per tablet Take 2 tablets by mouth every 4 (four) hours as needed (for pain scale equal to or greater than 7). (Patient not taking: Reported on 01/08/2015) 30 tablet 0  . Prenatal Vit-Fe Fumarate-FA (MULTIVITAMIN-PRENATAL) 27-0.8 MG TABS tablet Take 1 tablet by mouth daily at 12 noon. Reported on 11/10/2015    . sulfamethoxazole-trimethoprim (BACTRIM DS,SEPTRA DS) 800-160 MG per tablet Take 1 tablet by mouth 2 (two) times daily. (Patient not taking: Reported on 11/10/2015) 20 tablet 0   No current facility-administered medications on file prior to visit.    Allergies  Allergen Reactions  . Doxycycline Nausea And Vomiting    Review of Systems Objective:  BP 133/85 mmHg  Pulse  80  Temp(Src) 98.7 F (37.1 C) (Oral)  Resp 16  Ht 5\' 2"  (1.575 m)  Wt 176 lb 12.8 oz (80.196 kg)  BMI 32.33 kg/m2  SpO2 99%  LMP 07/30/2015  Physical Exam  Constitutional: She is oriented to person, place, and time and well-developed, well-nourished, and in no distress.  HENT:  Head: Normocephalic and atraumatic.  Right Ear: Hearing and external ear normal.  Left Ear: Hearing and external ear normal.  Eyes: Conjunctivae are normal.  Neck: Normal range of motion.  Pulmonary/Chest: Effort normal. Right breast exhibits no inverted nipple, no mass, no nipple discharge, no skin change and no tenderness. Left breast exhibits no inverted nipple, no mass, no nipple discharge, no skin change and no tenderness. Breasts are symmetrical.    Neurological: She is alert and oriented to person, place, and time. Gait normal.  Skin: Skin is warm and dry.  Psychiatric: Mood, memory, affect and judgment normal.  Tearful in exam when talking about her current situation  Vitals reviewed.   Results for orders placed or performed in visit on 11/10/15  POCT urine pregnancy  Result Value Ref Range   Preg Test, Ur Positive (A) Negative    Assessment and Plan :  Amenorrhea - Plan: POCT urine pregnancy  Cherry hemangioma - normal breast exam - d/w pt cherry hemangiomas and what they mean and the fact that they are benign collections of blood vessels -  she seemed rest assured - she is under a current extreme amount of stress with her current unplanned pregnancy and marital situation - information was given to the patient regarding pregnancy termination but we discussed the timing and that she needs to make a decision very quickly due to advancing pregnancy.  She will contact me for further concerns or questions.  Windell Hummingbird PA-C  [facility] 11/15/2015 6:59 AM

## 2016-02-29 ENCOUNTER — Encounter: Payer: Self-pay | Admitting: Obstetrics and Gynecology

## 2016-02-29 ENCOUNTER — Ambulatory Visit (INDEPENDENT_AMBULATORY_CARE_PROVIDER_SITE_OTHER): Payer: Self-pay | Admitting: Obstetrics and Gynecology

## 2016-02-29 VITALS — BP 141/65 | HR 92 | Wt 179.8 lb

## 2016-02-29 DIAGNOSIS — O99019 Anemia complicating pregnancy, unspecified trimester: Secondary | ICD-10-CM

## 2016-02-29 DIAGNOSIS — Z3689 Encounter for other specified antenatal screening: Secondary | ICD-10-CM

## 2016-02-29 DIAGNOSIS — O0933 Supervision of pregnancy with insufficient antenatal care, third trimester: Secondary | ICD-10-CM

## 2016-02-29 DIAGNOSIS — Z113 Encounter for screening for infections with a predominantly sexual mode of transmission: Secondary | ICD-10-CM

## 2016-02-29 DIAGNOSIS — Z23 Encounter for immunization: Secondary | ICD-10-CM

## 2016-02-29 DIAGNOSIS — Z3490 Encounter for supervision of normal pregnancy, unspecified, unspecified trimester: Secondary | ICD-10-CM

## 2016-02-29 DIAGNOSIS — O99013 Anemia complicating pregnancy, third trimester: Secondary | ICD-10-CM

## 2016-02-29 DIAGNOSIS — Z98891 History of uterine scar from previous surgery: Secondary | ICD-10-CM

## 2016-02-29 DIAGNOSIS — O98313 Other infections with a predominantly sexual mode of transmission complicating pregnancy, third trimester: Secondary | ICD-10-CM

## 2016-02-29 DIAGNOSIS — O0993 Supervision of high risk pregnancy, unspecified, third trimester: Secondary | ICD-10-CM

## 2016-02-29 DIAGNOSIS — O09293 Supervision of pregnancy with other poor reproductive or obstetric history, third trimester: Secondary | ICD-10-CM

## 2016-02-29 DIAGNOSIS — A6000 Herpesviral infection of urogenital system, unspecified: Secondary | ICD-10-CM

## 2016-02-29 DIAGNOSIS — O09299 Supervision of pregnancy with other poor reproductive or obstetric history, unspecified trimester: Secondary | ICD-10-CM | POA: Insufficient documentation

## 2016-02-29 LAB — POCT URINALYSIS DIP (DEVICE)
BILIRUBIN URINE: NEGATIVE
Glucose, UA: NEGATIVE mg/dL
HGB URINE DIPSTICK: NEGATIVE
KETONES UR: 15 mg/dL — AB
Leukocytes, UA: NEGATIVE
Nitrite: NEGATIVE
PH: 7 (ref 5.0–8.0)
Protein, ur: 30 mg/dL — AB
SPECIFIC GRAVITY, URINE: 1.02 (ref 1.005–1.030)
Urobilinogen, UA: 1 mg/dL (ref 0.0–1.0)

## 2016-02-29 MED ORDER — TETANUS-DIPHTH-ACELL PERTUSSIS 5-2.5-18.5 LF-MCG/0.5 IM SUSP
0.5000 mL | Freq: Once | INTRAMUSCULAR | Status: AC
  Administered 2016-02-29: 0.5 mL via INTRAMUSCULAR

## 2016-02-29 MED ORDER — ASPIRIN EC 81 MG PO TBEC: 81.0000 mg | DELAYED_RELEASE_TABLET | Freq: Every day | ORAL | Status: DC

## 2016-02-29 NOTE — Progress Notes (Signed)
New ob/28 wk packet given  tdap vaccine today Anatomy US scheduled for July 12th 1315.  Pt notified.

## 2016-02-29 NOTE — Progress Notes (Signed)
Subjective:  Samantha Francis is a 35 y.o. [id number] at [redacted]w[redacted]d being seen today for initial prenatal care.  She is currently monitored for the following issues for this low-risk pregnancy and has Cough and Cesarean delivery delivered on her problem list.  Patient reports no complaints.  Contractions: Not present. Vag. Bleeding: None.  Movement: Present. Denies leaking of fluid.   The following portions of the patient's history were reviewed and updated as appropriate: allergies, current medications, past family history, past medical history, past social history, past surgical history and problem list. Problem list updated.  Objective:   Filed Vitals:   02/29/16 1425  BP: 141/65  Pulse: 92  Weight: 179 lb 12.8 oz (81.557 kg)    Fetal Status: Fetal Heart Rate (bpm): 141   Movement: Present     General:  Alert, oriented and cooperative. Patient is in no acute distress.  Skin: Skin is warm and dry. No rash noted.   Cardiovascular: Normal heart rate noted  Respiratory: Normal respiratory effort, no problems with respiration noted  Abdomen: Soft, gravid, appropriate for gestational age. Pain/Pressure: Present     Pelvic: Cervical exam deferred        Extremities: Normal range of motion.  Edema: Trace  Mental Status: Normal mood and affect. Normal behavior. Normal judgment and thought content.   Urinalysis:      Assessment and Plan:  Pregnancy: [id number] at [redacted]w[redacted]d  1. Supervision of high risk pregnancy, antepartum, third trimester - Prenatal Profile - Culture, OB Urine - Pain Mgmt, Profile 6 Conf w/o mM, U - Cytology - PAP - roi completed and sent - GC/Chlamydia probe amp ([place])not at [facility] - [place] MFM OB COMP + 73 WK; Future - Hemoglobinopathy evaluation - Glucose Tolerance, 1 HR (50g) w/o Fasting  # HSV - ppx @ 36 wks  # hx c/s - desires repeat  Preterm labor symptoms and general obstetric precautions including but not limited to vaginal bleeding, contractions, leaking  of fluid and fetal movement were reviewed in detail with the patient. Please refer to After Visit Summary for other counseling recommendations.  F/u 2 wks  Gwynne Edinger, MD

## 2016-03-01 LAB — CULTURE, OB URINE: Colony Count: 8000

## 2016-03-01 LAB — GLUCOSE TOLERANCE, 1 HOUR (50G) W/O FASTING: Glucose, 1 Hr, gestational: 127 mg/dL (ref <140)

## 2016-03-02 LAB — PRENATAL PROFILE (SOLSTAS)
Antibody Screen: NEGATIVE
Basophils Absolute: 0 {cells}/uL (ref 0–200)
Basophils Relative: 0 %
Eosinophils Absolute: 0 {cells}/uL — ABNORMAL LOW (ref 15–500)
Eosinophils Relative: 0 %
HCT: 25.1 % — ABNORMAL LOW (ref 35.0–45.0)
HIV 1&2 Ab, 4th Generation: NONREACTIVE
Hemoglobin: 8 g/dL — ABNORMAL LOW (ref 11.7–15.5)
Hepatitis B Surface Ag: NEGATIVE
Lymphocytes Relative: 26 %
Lymphs Abs: 1326 {cells}/uL (ref 850–3900)
MCH: 25.2 pg — ABNORMAL LOW (ref 27.0–33.0)
MCHC: 31.9 g/dL — ABNORMAL LOW (ref 32.0–36.0)
MCV: 79.2 fL — ABNORMAL LOW (ref 80.0–100.0)
MPV: 10.8 fL (ref 7.5–12.5)
Monocytes Absolute: 306 {cells}/uL (ref 200–950)
Monocytes Relative: 6 %
Neutro Abs: 3468 {cells}/uL (ref 1500–7800)
Neutrophils Relative %: 68 %
Platelets: 189 K/uL (ref 140–400)
RBC: 3.17 MIL/uL — ABNORMAL LOW (ref 3.80–5.10)
RDW: 15.4 % — ABNORMAL HIGH (ref 11.0–15.0)
Rh Type: POSITIVE
Rubella: 12.4 {index} — ABNORMAL HIGH
WBC: 5.1 K/uL (ref 3.8–10.8)

## 2016-03-02 LAB — PAIN MGMT, PROFILE 6 CONF W/O MM, U
6 Acetylmorphine: NEGATIVE ng/mL
Alcohol Metabolites: NEGATIVE ng/mL
Amphetamines: NEGATIVE ng/mL
Barbiturates: NEGATIVE ng/mL
Benzodiazepines: NEGATIVE ng/mL
Cocaine Metabolite: NEGATIVE ng/mL
Creatinine: 160.9 mg/dL
Marijuana Metabolite: NEGATIVE ng/mL
Methadone Metabolite: NEGATIVE ng/mL
Opiates: NEGATIVE ng/mL
Oxidant: NEGATIVE ug/mL
Oxycodone: NEGATIVE ng/mL
Phencyclidine: NEGATIVE ng/mL
Please note:: 0
pH: 7.58

## 2016-03-03 DIAGNOSIS — O99019 Anemia complicating pregnancy, unspecified trimester: Secondary | ICD-10-CM | POA: Insufficient documentation

## 2016-03-03 MED ORDER — FERROUS SULFATE 325 (65 FE) MG PO TABS: 325.0000 mg | ORAL_TABLET | Freq: Three times a day (TID) | ORAL | Status: DC

## 2016-03-03 MED ORDER — DOCUSATE SODIUM 100 MG PO CAPS: 100.0000 mg | ORAL_CAPSULE | Freq: Two times a day (BID) | ORAL | Status: DC | PRN

## 2016-03-03 NOTE — Addendum Note (Signed)
Addended by: Laurey Arrow B on: 03/03/2016 08:46 AM   Modules accepted: Orders

## 2016-03-08 ENCOUNTER — Telehealth: Payer: Self-pay | Admitting: *Deleted

## 2016-03-08 NOTE — Telephone Encounter (Signed)
Called pt and left message that I am calling with important test results. Please call back and state whether we can leave a detailed message.

## 2016-03-08 NOTE — Telephone Encounter (Signed)
-----   Message from Gwynne Edinger, MD sent at 03/03/2016  8:42 AM EDT ----- Please contact patient and share she has significant anemia. She should start taking iron with every meal. I've sent that script, along with colace, to her pharmacy. We will plan to re-check her hemoglobin in 4-6 wks. Thanks, Olen Cordial  ----- Message -----    From: [facility]    Sent: 02/29/2016   8:42 PM      To: Gwynne Edinger, MD

## 2016-03-09 ENCOUNTER — Ambulatory Visit (HOSPITAL_COMMUNITY)
Admission: RE | Admit: 2016-03-09 | Discharge: 2016-03-09 | Disposition: A | Discharge status: Home / Self Care | Payer: 59 | Source: Ambulatory Visit | Attending: Obstetrics and Gynecology | Admitting: Obstetrics and Gynecology

## 2016-03-09 DIAGNOSIS — Z3A31 31 weeks gestation of pregnancy: Secondary | ICD-10-CM | POA: Insufficient documentation

## 2016-03-09 DIAGNOSIS — O98519 Other viral diseases complicating pregnancy, unspecified trimester: Secondary | ICD-10-CM | POA: Insufficient documentation

## 2016-03-09 DIAGNOSIS — Z3689 Encounter for other specified antenatal screening: Secondary | ICD-10-CM

## 2016-03-09 DIAGNOSIS — O0993 Supervision of high risk pregnancy, unspecified, third trimester: Secondary | ICD-10-CM

## 2016-03-09 DIAGNOSIS — O0933 Supervision of pregnancy with insufficient antenatal care, third trimester: Secondary | ICD-10-CM | POA: Insufficient documentation

## 2016-03-09 DIAGNOSIS — Z36 Encounter for antenatal screening of mother: Secondary | ICD-10-CM | POA: Insufficient documentation

## 2016-03-09 DIAGNOSIS — O34219 Maternal care for unspecified type scar from previous cesarean delivery: Secondary | ICD-10-CM | POA: Diagnosis not present

## 2016-03-09 DIAGNOSIS — O3410 Maternal care for benign tumor of corpus uteri, unspecified trimester: Secondary | ICD-10-CM | POA: Diagnosis present

## 2016-03-09 DIAGNOSIS — Z3490 Encounter for supervision of normal pregnancy, unspecified, unspecified trimester: Secondary | ICD-10-CM

## 2016-03-09 NOTE — Telephone Encounter (Signed)
Spoke with patient made her aware of lab results and to start taking iron supplements.

## 2016-03-21 ENCOUNTER — Encounter (HOSPITAL_COMMUNITY): Payer: Self-pay | Admitting: *Deleted

## 2016-03-21 ENCOUNTER — Encounter: Payer: Self-pay | Admitting: Advanced Practice Midwife

## 2016-03-21 ENCOUNTER — Ambulatory Visit (INDEPENDENT_AMBULATORY_CARE_PROVIDER_SITE_OTHER): Payer: 59 | Admitting: Advanced Practice Midwife

## 2016-03-21 ENCOUNTER — Encounter: Payer: Self-pay | Admitting: Clinical

## 2016-03-21 VITALS — BP 137/72 | HR 76 | Wt 177.3 lb

## 2016-03-21 DIAGNOSIS — O99013 Anemia complicating pregnancy, third trimester: Secondary | ICD-10-CM

## 2016-03-21 DIAGNOSIS — Z8759 Personal history of other complications of pregnancy, childbirth and the puerperium: Secondary | ICD-10-CM

## 2016-03-21 DIAGNOSIS — B009 Herpesviral infection, unspecified: Secondary | ICD-10-CM | POA: Diagnosis not present

## 2016-03-21 DIAGNOSIS — F4321 Adjustment disorder with depressed mood: Secondary | ICD-10-CM

## 2016-03-21 DIAGNOSIS — O98513 Other viral diseases complicating pregnancy, third trimester: Secondary | ICD-10-CM | POA: Diagnosis not present

## 2016-03-21 DIAGNOSIS — O09893 Supervision of other high risk pregnancies, third trimester: Secondary | ICD-10-CM

## 2016-03-21 MED ORDER — VALACYCLOVIR HCL 1 G PO TABS: 1000.0000 mg | ORAL_TABLET | Freq: Every day | ORAL | 2 refills | Status: DC

## 2016-03-21 NOTE — Progress Notes (Signed)
Subjective:  Samantha Francis is a 35 y.o. [id number] at [redacted]w[redacted]d being seen today for ongoing prenatal care.  She is currently monitored for the following issues for this low-risk pregnancy and has Supervision of low-risk pregnancy; History of cesarean section; Genital HSV; Hx of preeclampsia, prior pregnancy, currently pregnant; and Anemia affecting pregnancy on her problem list.  Patient reports high stress from husband's recent MI (doing well), death in the family. State she is coping adequately, but would like to talk to someone. .  Contractions: Not present. Vag. Bleeding: None.  Movement: Present. Denies leaking of fluid.   The following portions of the patient's history were reviewed and updated as appropriate: allergies, current medications, past family history, past medical history, past social history, past surgical history and problem list. Problem list updated.  Objective:   Vitals:   03/21/16 1025  BP: 137/72  Pulse: 76  Weight: 177 lb 4.8 oz (80.4 kg)    Fetal Status: Fetal Heart Rate (bpm): 128 Fundal Height: 35 cm Movement: Present     General:  Alert, oriented and cooperative. Patient is in no acute distress.  Skin: Skin is warm and dry. No rash noted.   Cardiovascular: Normal heart rate noted  Respiratory: Normal respiratory effort, no problems with respiration noted  Abdomen: Soft, gravid, appropriate for gestational age. Pain/Pressure: Absent     Pelvic:  Cervical exam deferred        Extremities: Normal range of motion.  Edema: Trace  Mental Status: Normal mood and affect. Normal behavior. Normal judgment and thought content.   Urinalysis: Urine Protein: 1+ Urine Glucose: Negative  Assessment and Plan:  Pregnancy: [id number] at [redacted]w[redacted]d  1. Herpes infection in pregnancy, third trimester  - valACYclovir (VALTREX) 1000 MG tablet; Take 1 tablet (1,000 mg total) by mouth daily.  Dispense: 30 tablet; Refill: 2  2. Anemia complicating pregnancy, third trimester -  Continue FeSO4  3. Supervision of other high risk pregnancies, third trimester   4. History of gestational hypertension  5. Situational depression - Meet w/ Roselyn Reef today.    Preterm labor symptoms and general obstetric precautions including but not limited to vaginal bleeding, contractions, leaking of fluid and fetal movement were reviewed in detail with the patient. Please refer to After Visit Summary for other counseling recommendations.  Return in about 2 weeks (around 04/04/2016).   Manya Silvas, CNM

## 2016-03-21 NOTE — Progress Notes (Signed)
States under a lot of stress- husband had heart attack 2 weeks ago and husbands father died last week.

## 2016-03-21 NOTE — Patient Instructions (Signed)

## 2016-03-21 NOTE — Progress Notes (Signed)
Brief introduction with pt; pt will come back to speak further about life stressors at next visit with [facility].

## 2016-03-22 ENCOUNTER — Other Ambulatory Visit: Payer: Self-pay | Admitting: Obstetrics & Gynecology

## 2016-03-22 LAB — POCT URINALYSIS DIP (DEVICE)
Glucose, UA: NEGATIVE mg/dL
HGB URINE DIPSTICK: NEGATIVE
KETONES UR: 80 mg/dL — AB
Leukocytes, UA: NEGATIVE
Nitrite: NEGATIVE
PH: 7 (ref 5.0–8.0)
Protein, ur: 30 mg/dL — AB
SPECIFIC GRAVITY, URINE: 1.025 (ref 1.005–1.030)
Urobilinogen, UA: 1 mg/dL (ref 0.0–1.0)

## 2016-04-05 ENCOUNTER — Ambulatory Visit (INDEPENDENT_AMBULATORY_CARE_PROVIDER_SITE_OTHER): Payer: 59 | Admitting: Family

## 2016-04-05 VITALS — BP 127/72 | HR 79 | Wt 183.2 lb

## 2016-04-05 DIAGNOSIS — Z3493 Encounter for supervision of normal pregnancy, unspecified, third trimester: Secondary | ICD-10-CM

## 2016-04-05 DIAGNOSIS — O09293 Supervision of pregnancy with other poor reproductive or obstetric history, third trimester: Secondary | ICD-10-CM

## 2016-04-05 DIAGNOSIS — Z98891 History of uterine scar from previous surgery: Secondary | ICD-10-CM

## 2016-04-05 LAB — POCT URINALYSIS DIP (DEVICE)
GLUCOSE, UA: NEGATIVE mg/dL
HGB URINE DIPSTICK: NEGATIVE
Ketones, ur: 40 mg/dL — AB
Nitrite: NEGATIVE
PROTEIN: 30 mg/dL — AB
SPECIFIC GRAVITY, URINE: 1.02 (ref 1.005–1.030)
UROBILINOGEN UA: 1 mg/dL (ref 0.0–1.0)
pH: 7 (ref 5.0–8.0)

## 2016-04-05 NOTE — Progress Notes (Signed)
Subjective:  Samantha Francis is a 35 y.o. [id number] at [redacted]w[redacted]d being seen today for ongoing prenatal care.  She is currently monitored for the following issues for this low-risk pregnancy and has Supervision of low-risk pregnancy; History of cesarean section; Genital HSV; Hx of preeclampsia, prior pregnancy, currently pregnant; and Anemia affecting pregnancy on her problem list.  Patient reports no complaints.  Contractions: Not present. Vag. Bleeding: None.  Movement: Present. Denies leaking of fluid.   The following portions of the patient's history were reviewed and updated as appropriate: allergies, current medications, past family history, past medical history, past social history, past surgical history and problem list. Problem list updated.  Objective:   Vitals:   04/05/16 0955  BP: 127/72  Pulse: 79  Weight: 183 lb 3.2 oz (83.1 kg)    Fetal Status: Fetal Heart Rate (bpm): 130   Movement: Present     General:  Alert, oriented and cooperative. Patient is in no acute distress.  Skin: Skin is warm and dry. No rash noted.   Cardiovascular: Normal heart rate noted  Respiratory: Normal respiratory effort, no problems with respiration noted  Abdomen: Soft, gravid, appropriate for gestational age. Pain/Pressure: Present     Pelvic:  Cervical exam deferred        Extremities: Normal range of motion.     Mental Status: Normal mood and affect. Normal behavior. Normal judgment and thought content.   Urinalysis: Urine Protein: 1+ Urine Glucose: Negative  Assessment and Plan:  Pregnancy: [id number] at [redacted]w[redacted]d  1. Supervision of low-risk pregnancy, third trimester - GBS next visit  2. History of cesarean section - Scheduled for 04/29/16  3. Hx of preeclampsia, prior pregnancy, currently pregnant, third trimester - Continue ASA  Preterm labor symptoms and general obstetric precautions including but not limited to vaginal bleeding, contractions, leaking of fluid and fetal movement were  reviewed in detail with the patient. Please refer to After Visit Summary for other counseling recommendations.  Return in about 1 week (around 04/12/2016).   Venia Carbon Michiel Cowboy, CNM

## 2016-04-05 NOTE — Progress Notes (Signed)
40+ ketone

## 2016-04-18 ENCOUNTER — Ambulatory Visit (INDEPENDENT_AMBULATORY_CARE_PROVIDER_SITE_OTHER): Payer: 59 | Admitting: Family Medicine

## 2016-04-18 VITALS — BP 135/81 | HR 81 | Wt 180.1 lb

## 2016-04-18 DIAGNOSIS — O09293 Supervision of pregnancy with other poor reproductive or obstetric history, third trimester: Secondary | ICD-10-CM | POA: Diagnosis not present

## 2016-04-18 DIAGNOSIS — Z98891 History of uterine scar from previous surgery: Secondary | ICD-10-CM

## 2016-04-18 DIAGNOSIS — Z3493 Encounter for supervision of normal pregnancy, unspecified, third trimester: Secondary | ICD-10-CM

## 2016-04-18 DIAGNOSIS — O34219 Maternal care for unspecified type scar from previous cesarean delivery: Secondary | ICD-10-CM

## 2016-04-18 LAB — POCT URINALYSIS DIP (DEVICE)
GLUCOSE, UA: 250 mg/dL — AB
Hgb urine dipstick: NEGATIVE
Nitrite: NEGATIVE
PROTEIN: 30 mg/dL — AB
Specific Gravity, Urine: 1.025 (ref 1.005–1.030)
UROBILINOGEN UA: 2 mg/dL — AB (ref 0.0–1.0)
pH: 7 (ref 5.0–8.0)

## 2016-04-18 LAB — GLUCOSE, CAPILLARY: GLUCOSE-CAPILLARY: 69 mg/dL (ref 65–99)

## 2016-04-18 NOTE — Progress Notes (Signed)
Subjective:  Samantha Francis is a 35 y.o. [id number] at [redacted]w[redacted]d being seen today for ongoing prenatal care.  She is currently monitored for the following issues for this low-risk pregnancy and has Supervision of low-risk pregnancy; History of cesarean section; Genital HSV; Hx of preeclampsia, prior pregnancy, currently pregnant; and Anemia affecting pregnancy on her problem list.  Patient reports no complaints.  Contractions: Not present. Vag. Bleeding: None.  Movement: Present. Denies leaking of fluid.   The following portions of the patient's history were reviewed and updated as appropriate: allergies, current medications, past family history, past medical history, past social history, past surgical history and problem list. Problem list updated.  Objective:   Vitals:   04/18/16 1459  BP: 135/81  Pulse: 81  Weight: 180 lb 1.6 oz (81.7 kg)    Fetal Status: Fetal Heart Rate (bpm): 135   Movement: Present     General:  Alert, oriented and cooperative. Patient is in no acute distress.  Skin: Skin is warm and dry. No rash noted.   Cardiovascular: Normal heart rate noted  Respiratory: Normal respiratory effort, no problems with respiration noted  Abdomen: Soft, gravid, appropriate for gestational age. Pain/Pressure: Absent     Pelvic:  Cervical exam deferred        Extremities: Normal range of motion.  Edema: Trace  Mental Status: Normal mood and affect. Normal behavior. Normal judgment and thought content.   Urinalysis: Urine Protein: 1+ Urine Glucose: 2+  Assessment and Plan:  Pregnancy: [id number] at [redacted]w[redacted]d  1. Hx of preeclampsia, prior pregnancy, currently pregnant, third trimester  - Culture, beta strep (group b only)  2. Supervision of low-risk pregnancy, third trimester FHT normal  3. History of cesarean section Scheduled for 9/1.   Term labor symptoms and general obstetric precautions including but not limited to vaginal bleeding, contractions, leaking of fluid and fetal  movement were reviewed in detail with the patient. Please refer to After Visit Summary for other counseling recommendations.  No Follow-up on file.   Truett Mainland, DO

## 2016-04-19 LAB — GC/CHLAMYDIA PROBE AMP (~~LOC~~) NOT AT ARMC
CHLAMYDIA, DNA PROBE: NEGATIVE
NEISSERIA GONORRHEA: NEGATIVE

## 2016-04-20 ENCOUNTER — Inpatient Hospital Stay (HOSPITAL_COMMUNITY): Payer: 59 | Admitting: Anesthesiology

## 2016-04-20 ENCOUNTER — Encounter (HOSPITAL_COMMUNITY)
Admission: AD | Disposition: A | Discharge status: Home / Self Care | Payer: Self-pay | Source: Ambulatory Visit | Attending: Family Medicine

## 2016-04-20 ENCOUNTER — Encounter: Payer: Self-pay | Admitting: *Deleted

## 2016-04-20 ENCOUNTER — Encounter (HOSPITAL_COMMUNITY): Payer: Self-pay

## 2016-04-20 ENCOUNTER — Inpatient Hospital Stay (HOSPITAL_COMMUNITY)
Admission: AD | Admit: 2016-04-20 | Discharge: 2016-04-23 | DRG: 765 | Disposition: A | Discharge status: Home / Self Care | Payer: 59 | Source: Ambulatory Visit | Attending: Family Medicine | Admitting: Family Medicine

## 2016-04-20 DIAGNOSIS — O34211 Maternal care for low transverse scar from previous cesarean delivery: Secondary | ICD-10-CM | POA: Diagnosis present

## 2016-04-20 DIAGNOSIS — D649 Anemia, unspecified: Secondary | ICD-10-CM | POA: Diagnosis present

## 2016-04-20 DIAGNOSIS — O9962 Diseases of the digestive system complicating childbirth: Secondary | ICD-10-CM | POA: Diagnosis present

## 2016-04-20 DIAGNOSIS — K219 Gastro-esophageal reflux disease without esophagitis: Secondary | ICD-10-CM | POA: Diagnosis present

## 2016-04-20 DIAGNOSIS — O9832 Other infections with a predominantly sexual mode of transmission complicating childbirth: Secondary | ICD-10-CM | POA: Diagnosis present

## 2016-04-20 DIAGNOSIS — O4202 Full-term premature rupture of membranes, onset of labor within 24 hours of rupture: Secondary | ICD-10-CM | POA: Diagnosis present

## 2016-04-20 DIAGNOSIS — A6000 Herpesviral infection of urogenital system, unspecified: Secondary | ICD-10-CM | POA: Diagnosis present

## 2016-04-20 DIAGNOSIS — O9902 Anemia complicating childbirth: Secondary | ICD-10-CM | POA: Diagnosis present

## 2016-04-20 DIAGNOSIS — Z8249 Family history of ischemic heart disease and other diseases of the circulatory system: Secondary | ICD-10-CM | POA: Diagnosis not present

## 2016-04-20 DIAGNOSIS — Z3A37 37 weeks gestation of pregnancy: Secondary | ICD-10-CM | POA: Diagnosis not present

## 2016-04-20 DIAGNOSIS — Z98891 History of uterine scar from previous surgery: Secondary | ICD-10-CM

## 2016-04-20 DIAGNOSIS — Z302 Encounter for sterilization: Secondary | ICD-10-CM | POA: Diagnosis not present

## 2016-04-20 HISTORY — PX: TUBAL LIGATION: SHX77

## 2016-04-20 LAB — CULTURE, BETA STREP (GROUP B ONLY)

## 2016-04-20 LAB — CBC
HCT: 27.4 % — ABNORMAL LOW (ref 36.0–46.0)
HEMATOCRIT: 25.2 % — AB (ref 36.0–46.0)
HEMOGLOBIN: 8.7 g/dL — AB (ref 12.0–15.0)
HEMOGLOBIN: 7.9 g/dL — AB (ref 12.0–15.0)
MCH: 25.2 pg — ABNORMAL LOW (ref 26.0–34.0)
MCH: 24.2 pg — ABNORMAL LOW (ref 26.0–34.0)
MCHC: 31.8 g/dL (ref 30.0–36.0)
MCHC: 31.3 g/dL (ref 30.0–36.0)
MCV: 79.4 fL (ref 78.0–100.0)
MCV: 77.1 fL — AB (ref 78.0–100.0)
PLATELETS: 188 10*3/uL (ref 150–400)
Platelets: 162 10*3/uL (ref 150–400)
RBC: 3.45 MIL/uL — AB (ref 3.87–5.11)
RBC: 3.27 MIL/uL — ABNORMAL LOW (ref 3.87–5.11)
RDW: 17.9 % — ABNORMAL HIGH (ref 11.5–15.5)
RDW: 17.5 % — ABNORMAL HIGH (ref 11.5–15.5)
WBC: 7 10*3/uL (ref 4.0–10.5)
WBC: 9.8 10*3/uL (ref 4.0–10.5)

## 2016-04-20 LAB — TYPE AND SCREEN
ABO/RH(D): O POS
Antibody Screen: NEGATIVE

## 2016-04-20 LAB — RPR: RPR: NONREACTIVE

## 2016-04-20 SURGERY — Surgical Case
Anesthesia: Spinal

## 2016-04-20 MED ORDER — LACTATED RINGERS IV SOLN
INTRAVENOUS | Status: DC
  Administered 2016-04-20 (×2): via INTRAVENOUS

## 2016-04-20 MED ORDER — SIMETHICONE 80 MG PO CHEW: 80.0000 mg | CHEWABLE_TABLET | ORAL | Status: DC | PRN

## 2016-04-20 MED ORDER — OXYTOCIN 10 UNIT/ML IJ SOLN
INTRAMUSCULAR | Status: AC
  Filled 2016-04-20: qty 4

## 2016-04-20 MED ORDER — DIPHENHYDRAMINE HCL 25 MG PO CAPS
25.0000 mg | ORAL_CAPSULE | ORAL | Status: DC | PRN
  Filled 2016-04-20: qty 1

## 2016-04-20 MED ORDER — ONDANSETRON HCL 4 MG/2ML IJ SOLN: 4.0000 mg | Freq: Three times a day (TID) | INTRAMUSCULAR | Status: DC | PRN

## 2016-04-20 MED ORDER — ZOLPIDEM TARTRATE 5 MG PO TABS: 5.0000 mg | ORAL_TABLET | Freq: Every evening | ORAL | Status: DC | PRN

## 2016-04-20 MED ORDER — OXYTOCIN 40 UNITS IN LACTATED RINGERS INFUSION - SIMPLE MED: 2.5000 [IU]/h | INTRAVENOUS | Status: AC

## 2016-04-20 MED ORDER — FAMOTIDINE IN NACL 20-0.9 MG/50ML-% IV SOLN
20.0000 mg | Freq: Once | INTRAVENOUS | Status: AC
  Administered 2016-04-20: 20 mg via INTRAVENOUS
  Filled 2016-04-20: qty 50

## 2016-04-20 MED ORDER — ACETAMINOPHEN 500 MG PO TABS
1000.0000 mg | ORAL_TABLET | Freq: Four times a day (QID) | ORAL | Status: AC
  Administered 2016-04-20 – 2016-04-21 (×2): 1000 mg via ORAL
  Filled 2016-04-20 (×2): qty 2

## 2016-04-20 MED ORDER — NALBUPHINE HCL 10 MG/ML IJ SOLN: 5.0000 mg | Freq: Once | INTRAMUSCULAR | Status: DC | PRN

## 2016-04-20 MED ORDER — SIMETHICONE 80 MG PO CHEW
80.0000 mg | CHEWABLE_TABLET | Freq: Three times a day (TID) | ORAL | Status: DC
  Administered 2016-04-20 – 2016-04-23 (×10): 80 mg via ORAL
  Filled 2016-04-20 (×10): qty 1

## 2016-04-20 MED ORDER — TETANUS-DIPHTH-ACELL PERTUSSIS 5-2.5-18.5 LF-MCG/0.5 IM SUSP: 0.5000 mL | Freq: Once | INTRAMUSCULAR | Status: DC

## 2016-04-20 MED ORDER — SCOPOLAMINE 1 MG/3DAYS TD PT72
MEDICATED_PATCH | TRANSDERMAL | Status: AC
  Filled 2016-04-20: qty 1

## 2016-04-20 MED ORDER — PRENATAL MULTIVITAMIN CH
1.0000 | ORAL_TABLET | Freq: Every day | ORAL | Status: DC
  Administered 2016-04-20 – 2016-04-23 (×4): 1 via ORAL
  Filled 2016-04-20 (×4): qty 1

## 2016-04-20 MED ORDER — DEXAMETHASONE SODIUM PHOSPHATE 4 MG/ML IJ SOLN
INTRAMUSCULAR | Status: DC | PRN
  Administered 2016-04-20: 4 mg via INTRAVENOUS

## 2016-04-20 MED ORDER — NALBUPHINE HCL 10 MG/ML IJ SOLN: 5.0000 mg | INTRAMUSCULAR | Status: DC | PRN

## 2016-04-20 MED ORDER — OXYCODONE-ACETAMINOPHEN 5-325 MG PO TABS: 2.0000 | ORAL_TABLET | ORAL | Status: DC | PRN

## 2016-04-20 MED ORDER — SIMETHICONE 80 MG PO CHEW
80.0000 mg | CHEWABLE_TABLET | ORAL | Status: DC
  Administered 2016-04-21 – 2016-04-22 (×3): 80 mg via ORAL
  Filled 2016-04-20 (×3): qty 1

## 2016-04-20 MED ORDER — SOD CITRATE-CITRIC ACID 500-334 MG/5ML PO SOLN
30.0000 mL | Freq: Once | ORAL | Status: AC
  Administered 2016-04-20: 30 mL via ORAL
  Filled 2016-04-20: qty 15

## 2016-04-20 MED ORDER — MORPHINE SULFATE (PF) 0.5 MG/ML IJ SOLN
INTRAMUSCULAR | Status: DC | PRN
  Administered 2016-04-20: .1 mg via INTRATHECAL

## 2016-04-20 MED ORDER — MEPERIDINE HCL 25 MG/ML IJ SOLN
INTRAMUSCULAR | Status: DC | PRN
  Administered 2016-04-20 (×2): 12.5 mg via INTRAVENOUS

## 2016-04-20 MED ORDER — NALOXONE HCL 0.4 MG/ML IJ SOLN: 0.4000 mg | INTRAMUSCULAR | Status: DC | PRN

## 2016-04-20 MED ORDER — PHENYLEPHRINE 40 MCG/ML (10ML) SYRINGE FOR IV PUSH (FOR BLOOD PRESSURE SUPPORT)
PREFILLED_SYRINGE | INTRAVENOUS | Status: AC
  Filled 2016-04-20: qty 10

## 2016-04-20 MED ORDER — DIPHENHYDRAMINE HCL 25 MG PO CAPS
25.0000 mg | ORAL_CAPSULE | Freq: Four times a day (QID) | ORAL | Status: DC | PRN
  Administered 2016-04-20: 25 mg via ORAL

## 2016-04-20 MED ORDER — LACTATED RINGERS IV SOLN
INTRAVENOUS | Status: DC
  Administered 2016-04-20: 10:00:00 via INTRAVENOUS

## 2016-04-20 MED ORDER — SODIUM CHLORIDE 0.9 % IR SOLN
Status: DC | PRN
  Administered 2016-04-20: 1000 mL

## 2016-04-20 MED ORDER — IBUPROFEN 600 MG PO TABS
600.0000 mg | ORAL_TABLET | Freq: Four times a day (QID) | ORAL | Status: DC
  Administered 2016-04-20 – 2016-04-23 (×13): 600 mg via ORAL
  Filled 2016-04-20 (×13): qty 1

## 2016-04-20 MED ORDER — ACETAMINOPHEN 325 MG PO TABS
650.0000 mg | ORAL_TABLET | ORAL | Status: DC | PRN
  Administered 2016-04-21: 650 mg via ORAL
  Filled 2016-04-20: qty 2

## 2016-04-20 MED ORDER — PHENYLEPHRINE HCL 10 MG/ML IJ SOLN
INTRAMUSCULAR | Status: DC | PRN
  Administered 2016-04-20: 40 ug via INTRAVENOUS
  Administered 2016-04-20 (×2): 80 ug via INTRAVENOUS

## 2016-04-20 MED ORDER — OXYTOCIN 10 UNIT/ML IJ SOLN
INTRAVENOUS | Status: DC | PRN
  Administered 2016-04-20: 40 [IU] via INTRAVENOUS

## 2016-04-20 MED ORDER — WITCH HAZEL-GLYCERIN EX PADS: 1.0000 "application " | MEDICATED_PAD | CUTANEOUS | Status: DC | PRN

## 2016-04-20 MED ORDER — FENTANYL CITRATE (PF) 100 MCG/2ML IJ SOLN
INTRAMUSCULAR | Status: DC | PRN
  Administered 2016-04-20: 25 ug via INTRATHECAL

## 2016-04-20 MED ORDER — MEPERIDINE HCL 25 MG/ML IJ SOLN: 6.2500 mg | INTRAMUSCULAR | Status: DC | PRN

## 2016-04-20 MED ORDER — ONDANSETRON HCL 4 MG/2ML IJ SOLN
INTRAMUSCULAR | Status: DC | PRN
  Administered 2016-04-20: 4 mg via INTRAVENOUS

## 2016-04-20 MED ORDER — DIBUCAINE 1 % RE OINT: 1.0000 "application " | TOPICAL_OINTMENT | RECTAL | Status: DC | PRN

## 2016-04-20 MED ORDER — SODIUM CHLORIDE 0.9% FLUSH: 3.0000 mL | INTRAVENOUS | Status: DC | PRN

## 2016-04-20 MED ORDER — CEFAZOLIN SODIUM-DEXTROSE 2-3 GM-% IV SOLR
INTRAVENOUS | Status: DC | PRN
  Administered 2016-04-20: 2 g via INTRAVENOUS

## 2016-04-20 MED ORDER — COCONUT OIL OIL: 1.0000 "application " | TOPICAL_OIL | Status: DC | PRN

## 2016-04-20 MED ORDER — SENNOSIDES-DOCUSATE SODIUM 8.6-50 MG PO TABS
2.0000 | ORAL_TABLET | ORAL | Status: DC
  Administered 2016-04-21 – 2016-04-22 (×3): 2 via ORAL
  Filled 2016-04-20 (×4): qty 2

## 2016-04-20 MED ORDER — SCOPOLAMINE 1 MG/3DAYS TD PT72
1.0000 | MEDICATED_PATCH | Freq: Once | TRANSDERMAL | Status: DC
  Filled 2016-04-20: qty 1

## 2016-04-20 MED ORDER — SCOPOLAMINE 1 MG/3DAYS TD PT72
MEDICATED_PATCH | TRANSDERMAL | Status: DC | PRN
  Administered 2016-04-20: 1 via TRANSDERMAL

## 2016-04-20 MED ORDER — PHENYLEPHRINE 8 MG IN D5W 100 ML (0.08MG/ML) PREMIX OPTIME
INJECTION | INTRAVENOUS | Status: DC | PRN
  Administered 2016-04-20: 40 ug/min via INTRAVENOUS

## 2016-04-20 MED ORDER — BUPIVACAINE HCL (PF) 0.25 % IJ SOLN
INTRAMUSCULAR | Status: DC | PRN
  Administered 2016-04-20: 30 mL

## 2016-04-20 MED ORDER — DIPHENHYDRAMINE HCL 50 MG/ML IJ SOLN: 12.5000 mg | INTRAMUSCULAR | Status: DC | PRN

## 2016-04-20 MED ORDER — DEXAMETHASONE SODIUM PHOSPHATE 4 MG/ML IJ SOLN
INTRAMUSCULAR | Status: AC
  Filled 2016-04-20: qty 1

## 2016-04-20 MED ORDER — MEPERIDINE HCL 25 MG/ML IJ SOLN
INTRAMUSCULAR | Status: AC
  Filled 2016-04-20: qty 1

## 2016-04-20 MED ORDER — ONDANSETRON HCL 4 MG/2ML IJ SOLN
INTRAMUSCULAR | Status: AC
  Filled 2016-04-20: qty 2

## 2016-04-20 MED ORDER — FENTANYL CITRATE (PF) 100 MCG/2ML IJ SOLN
INTRAMUSCULAR | Status: AC
  Filled 2016-04-20: qty 2

## 2016-04-20 MED ORDER — MENTHOL 3 MG MT LOZG: 1.0000 | LOZENGE | OROMUCOSAL | Status: DC | PRN

## 2016-04-20 MED ORDER — NALOXONE HCL 2 MG/2ML IJ SOSY
1.0000 ug/kg/h | PREFILLED_SYRINGE | INTRAMUSCULAR | Status: DC | PRN
  Filled 2016-04-20: qty 2

## 2016-04-20 MED ORDER — OXYCODONE-ACETAMINOPHEN 5-325 MG PO TABS: 1.0000 | ORAL_TABLET | ORAL | Status: DC | PRN

## 2016-04-20 MED ORDER — MORPHINE SULFATE-NACL 0.5-0.9 MG/ML-% IV SOSY
PREFILLED_SYRINGE | INTRAVENOUS | Status: AC
  Filled 2016-04-20: qty 1

## 2016-04-20 MED ORDER — PHENYLEPHRINE 8 MG IN D5W 100 ML (0.08MG/ML) PREMIX OPTIME
INJECTION | INTRAVENOUS | Status: AC
  Filled 2016-04-20: qty 100

## 2016-04-20 SURGICAL SUPPLY — 33 items
APL SKNCLS STERI-STRIP NONHPOA (GAUZE/BANDAGES/DRESSINGS) ×2
BENZOIN TINCTURE PRP APPL 2/3 (GAUZE/BANDAGES/DRESSINGS) ×1 IMPLANT
CHLORAPREP W/TINT 26ML (MISCELLANEOUS) ×3 IMPLANT
CLAMP CORD UMBIL (MISCELLANEOUS) IMPLANT
CLIP FILSHIE TUBAL LIGA STRL (Clip) ×2 IMPLANT
CLOTH BEACON ORANGE TIMEOUT ST (SAFETY) ×3 IMPLANT
DRSG OPSITE POSTOP 4X10 (GAUZE/BANDAGES/DRESSINGS) ×3 IMPLANT
ELECT REM PT RETURN 9FT ADLT (ELECTROSURGICAL) ×3
ELECTRODE REM PT RTRN 9FT ADLT (ELECTROSURGICAL) ×2 IMPLANT
EXTRACTOR VACUUM M CUP 4 TUBE (SUCTIONS) IMPLANT
GAUZE SPONGE 4X4 12PLY STRL (GAUZE/BANDAGES/DRESSINGS) ×2 IMPLANT
GLOVE BIOGEL PI IND STRL 7.0 (GLOVE) ×4 IMPLANT
GLOVE BIOGEL PI INDICATOR 7.0 (GLOVE) ×2
GLOVE ECLIPSE 7.0 STRL STRAW (GLOVE) ×6 IMPLANT
GOWN STRL REUS W/TWL LRG LVL3 (GOWN DISPOSABLE) ×6 IMPLANT
KIT ABG SYR 3ML LUER SLIP (SYRINGE) IMPLANT
NDL HYPO 25X5/8 SAFETYGLIDE (NEEDLE) IMPLANT
NEEDLE HYPO 22GX1.5 SAFETY (NEEDLE) ×3 IMPLANT
NEEDLE HYPO 25X5/8 SAFETYGLIDE (NEEDLE) IMPLANT
NS IRRIG 1000ML POUR BTL (IV SOLUTION) ×3 IMPLANT
PACK C SECTION WH (CUSTOM PROCEDURE TRAY) ×3 IMPLANT
PAD ABD 7.5X8 STRL (GAUZE/BANDAGES/DRESSINGS) ×1 IMPLANT
PAD OB MATERNITY 4.3X12.25 (PERSONAL CARE ITEMS) ×3 IMPLANT
PENCIL SMOKE EVAC W/HOLSTER (ELECTROSURGICAL) ×3 IMPLANT
RTRCTR C-SECT PINK 25CM LRG (MISCELLANEOUS) ×3 IMPLANT
STRIP CLOSURE SKIN 1/2X4 (GAUZE/BANDAGES/DRESSINGS) ×1 IMPLANT
SUT VIC AB 0 CTX 36 (SUTURE) ×9
SUT VIC AB 0 CTX36XBRD ANBCTRL (SUTURE) ×6 IMPLANT
SUT VIC AB 4-0 KS 27 (SUTURE) ×3 IMPLANT
SYR 30ML LL (SYRINGE) ×3 IMPLANT
TAPE CLOTH SURG 4X10 WHT LF (GAUZE/BANDAGES/DRESSINGS) ×1 IMPLANT
TOWEL OR 17X24 6PK STRL BLUE (TOWEL DISPOSABLE) ×3 IMPLANT
TRAY FOLEY CATH SILVER 14FR (SET/KITS/TRAYS/PACK) ×3 IMPLANT

## 2016-04-20 NOTE — Op Note (Signed)
  Preoperative Diagnosis:  IUP @ [redacted]w[redacted]d, Repeat cesarean with SROM  Postoperative Diagnosis:  Same  Procedure: Repeat low transverse cesarean section, Bilateral Tubal Ligation with Filshie clips  Surgeon: Darron Doom, M.D.  Assistant: Zenda Alpers, DO  Anesthesia: spinal with Lyndle Herrlich, MD  Findings: Viable female infant, APGAR (1 MIN): 10   APGAR (5 MINS): 10   APGAR (10 MINS):   Weight- pending Normal tubes and ovaries  Estimated blood loss: 650 cc IVF: 2100 LR Urine output: 250 cc clear yellow urine  Complications: None known  Specimens: Placenta to labor and delivery  Reason for procedure: Briefly, the patient is a 35 y.o. [id number] at [redacted]w[redacted]d with h/o previous C-section who desires permanent sterility.  Patient counseled, r.e. Risks benefits of BTL, including permanency of procedure, risk of failure(1:100), increased risk of ectopic.  Patient verbalized understanding and desires to proceed   Procedure: Patient is a to the OR where spinal analgesia was administered. She was then placed in a supine position with left lateral tilt. She received 2 g of Ancef and SCDs were in place. A Foley catheter was placed in the bladder. She was prepped and draped in the usual sterile fashion. A timeout was performed. A knife was then used to make a Pfannenstiel incision. This incision was carried out to underlying fascia which was divided in the midline with the knife. The incision was extended laterally, sharply. The fascia was dissected of the underlying rectus superiorly.  The rectus was divided in the midline.  The peritoneal cavity was entered bluntly.  Alexis retractor was placed inside the incision.  A knife was used to make a low transverse incision on the uterus. This incision was carried down to the amniotic cavity was entered. Fetus was in LOA position and was brought up out of the incision without difficulty. Cord was clamped x 2 and cut. Infant taken to waiting pediatrician.  Cord  blood was obtained. Placenta was delivered from the uterus.  Uterus was cleaned with dry lap pads. Uterine incision closed with 0 Vicryl suture in a locked running fashion. Attention was turned to the pt's left tube which was grasped with a Babcock clamp and followed to its fimbriated end.  A Filshie clip was placed across the tube 1.5 cm from the cornu.  Attention was turned to the pt's right tube which was grasped with a Babcock clamp and followed to its fimbriated end.  A Filshie clip was placed across the tube 1.5 cm from the cornu. A 1cc of 0.25% Marcaine was injected into the surrounding tubes bilaterally.  Alexis retractor was removed from the abdomen. Peritoneal closure was done with 0 Vicryl suture.  There was bleeding from the right rectus and electrocautery and suture was used to obtain hemostasis.  Fascia is closed with 0 Vicryl suture in a running fashion. Subcutaneous tissue infused with 30cc 0.25% Marcaine.  Subcutaneous closure was performed with 0 Plain suture.  Skin closed using 3-0 Vicryl on a Keith needle.  Steri strips applied, followed by pressure dressing.  All instrument, needle and lap counts were correct x 2.  Patient was awake and taken to PACU stable.  Infant to Newborn Nursery, stable.

## 2016-04-20 NOTE — Anesthesia Postprocedure Evaluation (Signed)
Anesthesia Post Note  Patient: Samantha Francis  Procedure(s) Performed: Procedure(s) (LRB): CESAREAN SECTION (N/A) BILATERAL TUBAL LIGATION (Bilateral)  Patient location during evaluation: Mother Baby Anesthesia Type: Spinal Level of consciousness: awake and alert and oriented Pain management: satisfactory to patient Vital Signs Assessment: post-procedure vital signs reviewed and stable Respiratory status: respiratory function stable and spontaneous breathing Cardiovascular status: blood pressure returned to baseline Postop Assessment: no headache, no backache, spinal receding, patient able to bend at knees and adequate PO intake Anesthetic complications: no     Last Vitals:  Vitals:   04/20/16 0805 04/20/16 0900  BP: (!) 104/56 (!) 106/55  Pulse: 60 60  Resp: 18 20  Temp: 36.9 C 36.9 C    Last Pain:  Vitals:   04/20/16 0900  TempSrc:   PainSc: 3    Pain Goal: Patients Stated Pain Goal: 4 (04/20/16 0805)               Katherina Mires

## 2016-04-20 NOTE — H&P (Signed)
LABOR AND DELIVERY ADMISSION HISTORY AND PHYSICAL NOTE  Samantha Francis is a 35 y.o. female [phone] with IUP at [redacted]w[redacted]d by [place] presenting for SROM.  Patient SROM this evening while watching TV. She states it was clear fluid. No contractions. She has had C/S x2 in past and a myomectomy.  She reports positive fetal movement. She denies vaginal bleeding.  Prenatal History/Complications:  Past Medical History: Past Medical History:  Diagnosis Date  . Allergic rhinitis   . Allergy   . Anemia    not with current pregnancy  . Asthma    childhood but never used an inhaler  . GERD (gastroesophageal reflux disease)    with pregnancy  . History of uterine fibroid   . HSV (herpes simplex virus) anogenital infection   . PONV (postoperative nausea and vomiting)   . Spinal headache     Past Surgical History: Past Surgical History:  Procedure Laterality Date  . CESAREAN SECTION  2009  . CESAREAN SECTION N/A 06/10/2014   Procedure: CESAREAN SECTION;  Surgeon: Allyn Kenner, DO;  Location: [facility];  Service: Obstetrics;  Laterality: N/A;  . MYOMECTOMY      Obstetrical History: OB History    Gravida Para Term Preterm AB Living   4 2 1   1 2    SAB TAB Ectopic Multiple Live Births   1       2      Social History: Social History   Social History  . Marital status: Married    Spouse name: N/A  . Number of children: N/A  . Years of education: N/A   Occupational History  . community support specialist    Social History Main Topics  . Smoking status: Never Smoker  . Smokeless tobacco: Never Used  . Alcohol use No  . Drug use: No  . Sexual activity: Not Currently     Comment: plans to discuss with provider 11/23 at appt   Other Topics Concern  . None   Social History Narrative  . None    Family History: Family History  Problem Relation Age of Onset  . Hyperlipidemia Mother   . Hypertension Father   . Hyperlipidemia Sister   . Hyperlipidemia Maternal Grandfather    . Hypertension Maternal Grandfather     Allergies: Allergies  Allergen Reactions  . Doxycycline Nausea And Vomiting    Prescriptions Prior to Admission  Medication Sig Dispense Refill Last Dose  . aspirin EC 81 MG tablet Take 1 tablet (81 mg total) by mouth daily. Take after 12 weeks for prevention of preeclampsia later in pregnancy 300 tablet 2 Taking  . docusate sodium (COLACE) 100 MG capsule Take 1 capsule (100 mg total) by mouth 2 (two) times daily as needed. 60 capsule 2 Taking  . ferrous sulfate (FERROUSUL) 325 (65 FE) MG tablet Take 1 tablet (325 mg total) by mouth 3 (three) times daily with meals. 90 tablet 3 Taking  . Prenatal Vit-Fe Fumarate-FA (PRENATAL MULTIVITAMIN) TABS tablet Take 1 tablet by mouth daily at 12 noon.     . valACYclovir (VALTREX) 1000 MG tablet Take 1 tablet (1,000 mg total) by mouth daily. 30 tablet 2 Taking     Review of Systems   All systems reviewed and negative except as stated in HPI  Last menstrual period 07/30/2015, unknown if currently breastfeeding. General appearance: alert, cooperative and appears stated age Lungs: clear to auscultation bilaterally Heart: regular rate and rhythm Abdomen: soft, non-tender; bowel sounds normal Extremities: No calf swelling  or tenderness Presentation: cephalic Fetal monitoring: 120, mod var, +accels, no decels Uterine activity: Not often     Prenatal labs: ABO, Rh: O/POS/-- (07/03 1541) Antibody: NEG (07/03 1541) Rubella: !Error! RPR: NON REAC (07/03 1541)  HBsAg: NEGATIVE (07/03 1541)  HIV: NONREACTIVE (07/03 1541)  GBS:    1 hr Glucola: 127 Genetic screening:  None Anatomy US: WNL  Prenatal Transfer Tool  Maternal Diabetes: No Genetic Screening: Declined Maternal Ultrasounds/Referrals: Normal Fetal Ultrasounds or other Referrals:  None Maternal Substance Abuse:  No Significant Maternal Medications:  None Significant Maternal Lab Results: None  No results found for this or any previous  visit (from the past 24 hour(s)).  Patient Active Problem List   Diagnosis Date Noted  . Anemia affecting pregnancy 03/03/2016  . Supervision of low-risk pregnancy 02/29/2016  . History of cesarean section 02/29/2016  . 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 Hx of preeclampsia, prior pregnancy, currently pregnant 02/29/2016    Assessment: Samantha Francis is a 35 y.o. [id number] at [redacted]w[redacted]d here for SROM, to have repeat C/S.  #Labor: Repeat cesarean #Pain: Cesarean protocol #FWB: Cat I #ID:  Pending GBS #MOF: Bottle #MOC:BTL #Circ:  Inpatient Circ  [facility], DO 04/20/2016, 1:38 AM

## 2016-04-20 NOTE — Transfer of Care (Signed)
Immediate Anesthesia Transfer of Care Note  Patient: Samantha Francis  Procedure(s) Performed: Procedure(s): CESAREAN SECTION (N/A) BILATERAL TUBAL LIGATION (Bilateral)  Patient Location: PACU  Anesthesia Type:Spinal  Level of Consciousness: awake, alert  and oriented  Airway & Oxygen Therapy: Patient Spontanous Breathing  Post-op Assessment: Report given to RN and Post -op Vital signs reviewed and stable  Post vital signs: Reviewed and stable  Last Vitals: There were no vitals filed for this visit.  Last Pain: There were no vitals filed for this visit.       Complications: No apparent anesthesia complications

## 2016-04-20 NOTE — Lactation Note (Addendum)
This note was copied from a baby's chart. Lactation Consultation Note: Lactation brochure given to mother with basic teaching done from 63 and Me book. Discussed cue based feeding with cue card and allowing for cluster feeding. Infant is 37.6/7 weeks . Weight at 5-13 lbs. Mother states that this is her first child to breastfeed. Her youngest child at home is 77 yrs.  Infant has had several attempts to breast but with only a few sucks.  Infant was placed in football hold and in cross cradle to teach mother positioning. Infant refused to latch . Attempt to hand express into a spoon but only a few small drops of colostrum present.  Infant was given 5 ml of formula with a gloved finger and curved tip syringe. Advised to mother to call for assistance with next feeding. Discussed if infant unable to latch with next feeding will offer more supplement. Mother has a hand pump at the bedside.  Suggested to mother to feed infant 8-12 times in 24 hours. Suggested more skin to skin. Mother informed of Lactation services and community support.   Patient Name: Samantha Francis [id number] Date: 04/20/2016 Reason for consult: Initial assessment   Maternal Data    Feeding Feeding Type: Formula Length of feed: 2 min  LATCH Score/Interventions Latch: Too sleepy or reluctant, no latch achieved, no sucking elicited. Intervention(s): Skin to skin;Teach feeding cues;Waking techniques Intervention(s): Adjust position;Assist with latch  Audible Swallowing: None  Type of Nipple: Everted at rest and after stimulation  Comfort (Breast/Nipple): Soft / non-tender     Hold (Positioning): Assistance needed to correctly position infant at breast and maintain latch. Intervention(s): Breastfeeding basics reviewed;Support Pillows;Position options;Skin to skin  LATCH Score: 5  Lactation Tools Discussed/Used     Consult Status Consult Status: Follow-up Date: 04/21/16 Follow-up type:  In-patient    Jess Barters [facility] 04/20/2016, 3:09 PM

## 2016-04-20 NOTE — Anesthesia Procedure Notes (Signed)
Spinal

## 2016-04-20 NOTE — Anesthesia Preprocedure Evaluation (Signed)
Anesthesia Evaluation  Patient identified by MRN, date of birth, ID band Patient awake    Reviewed: Allergy & Precautions, H&P , NPO status , Patient's Chart, lab work & pertinent test results  History of Anesthesia Complications (+) PONV, POST - OP SPINAL HEADACHE and history of anesthetic complications  Airway Mallampati: II  TM Distance: >3 FB Neck ROM: full    Dental no notable dental hx.    Pulmonary asthma ,    Pulmonary exam normal breath sounds clear to auscultation       Cardiovascular Exercise Tolerance: Good  Rhythm:regular Rate:Normal     Neuro/Psych  Headaches,    GI/Hepatic GERD  ,  Endo/Other    Renal/GU      Musculoskeletal   Abdominal   Peds  Hematology  (+) anemia ,   Anesthesia Other Findings   Reproductive/Obstetrics (+) Pregnancy                             Anesthesia Physical  Anesthesia Plan  ASA: II  Anesthesia Plan: Spinal   Post-op Pain Management:    Induction:   Airway Management Planned:   Additional Equipment:   Intra-op Plan:   Post-operative Plan:   Informed Consent: I have reviewed the patients History and Physical, chart, labs and discussed the procedure including the risks, benefits and alternatives for the proposed anesthesia with the patient or authorized representative who has indicated his/her understanding and acceptance.     Plan Discussed with: Anesthesiologist, CRNA and Surgeon  Anesthesia Plan Comments:         Anesthesia Quick Evaluation

## 2016-04-20 NOTE — Anesthesia Procedure Notes (Signed)
Anesthesia Regional Block: Narrative:       

## 2016-04-20 NOTE — Anesthesia Procedure Notes (Signed)
Spinal  Patient location during procedure: OR Preanesthetic Checklist Completed: patient identified, site marked, surgical consent, pre-op evaluation, timeout performed, IV checked, risks and benefits discussed and monitors and equipment checked Spinal Block Patient position: sitting Prep: DuraPrep Patient monitoring: cardiac monitor, continuous pulse ox, blood pressure and heart rate Approach: midline Location: L3-4 Injection technique: catheter Needle Needle type: Tuohy and Sprotte  Needle gauge: 24 G Needle length: 12.7 cm Needle insertion depth: 5 cm Catheter type: closed end flexible Catheter size: 19 g Catheter at skin depth: 11 cm Additional Notes Spinal Dosage in OR; thru touhy  Bupivicaine ml       1.5 PFMS04   mcg        100 Fentanyl mcg            25  (-) asp CSF/heme on catheter

## 2016-04-21 MED ORDER — POLYETHYLENE GLYCOL 3350 17 G PO PACK
17.0000 g | PACK | Freq: Every day | ORAL | Status: DC
  Administered 2016-04-21 – 2016-04-23 (×3): 17 g via ORAL
  Filled 2016-04-21 (×4): qty 1

## 2016-04-21 MED ORDER — FERROUS SULFATE 325 (65 FE) MG PO TABS
325.0000 mg | ORAL_TABLET | Freq: Two times a day (BID) | ORAL | Status: DC
  Administered 2016-04-21 – 2016-04-23 (×5): 325 mg via ORAL
  Filled 2016-04-21 (×5): qty 1

## 2016-04-21 NOTE — Progress Notes (Addendum)
Post Partum Day 1  Subjective:  Samantha Francis is a 35 y.o. [id number] [redacted]w[redacted]d s/p RLTCS.  No acute events overnight.  Pt denies problems with ambulating, voiding or po intake.  She denies nausea or vomiting.  Pain is well controlled.  She has not had flatus. She has not had bowel movement.  Lochia Minimal.  Plan for birth control is bilateral tubal ligation.  Method of Feeding: Bottle  Objective: BP 110/66 (BP Location: Right Arm)   Pulse 65   Temp 98.5 F (36.9 C) (Oral)   Resp 20   Ht 5\' 2"  (1.575 m)   Wt 81.6 kg (180 lb)   LMP 07/30/2015 (Exact Date)   SpO2 100%   Breastfeeding? Unknown   BMI 32.92 kg/m   Physical Exam:  General: alert, cooperative and no distress Lochia:normal flow Abdomen: +BS, soft, nontender, fundus firm at/below umbilicus Uterine Fundus: firm, DVT Evaluation: No evidence of DVT seen on physical exam. Extremities: no edema   Recent Labs  04/20/16 0105 04/20/16 0625  HGB 8.7* 7.9*  HCT 27.4* 25.2*    Assessment/Plan:  ASSESSMENT: Samantha Francis is a 35 y.o. [id number] [redacted]w[redacted]d ppd #1 s/p NSVD/VAVD/FAVD doing well.   Anemia - started on iron today  Plan for discharge tomorrow   LOS: 1 day   [place] 04/21/2016, 6:59 AM   CNM attestation Post Partum Day #1 I have seen and examined this patient and agree with above documentation in the resident's note.   Samantha Francis is a 34 y.o. [id number] s/p rLTCS.  Pt denies problems with ambulating, voiding or po intake. Pain is well controlled.  Plan for birth control is bilateral tubal ligation.  Method of Feeding: bottle  PE:  BP 110/66 (BP Location: Right Arm)   Pulse 65   Temp 98.5 F (36.9 C) (Oral)   Resp 20   Ht 5\' 2"  (1.575 m)   Wt 81.6 kg (180 lb)   LMP 07/30/2015 (Exact Date)   SpO2 100%   Breastfeeding? Unknown   BMI 32.92 kg/m  Fundus firm  Plan for discharge: 8/25 or 8/26, depending on infant bili levels  Serita Grammes, CNM 9:23 AM  04/21/2016

## 2016-04-21 NOTE — Plan of Care (Signed)
Problem: Activity: Goal: Risk for activity intolerance will decrease Outcome: Completed/Met Date Met: 04/21/16 Encouraged increased ambulation and ambulating in hallway today x3.  Problem: Bowel/Gastric: Goal: Will not experience complications related to bowel motility Outcome: Completed/Met Date Met: 04/21/16 Patient passing flatus. MD started mirilax due to starting iron. Encouraged increased po fluids and ambulation.

## 2016-04-21 NOTE — Lactation Note (Addendum)
This note was copied from a baby's chart. Lactation Consultation Note  Patient Name: Samantha Francis [id number] Date: 04/21/2016 Reason for consult: Follow-up assessment Baby at 39 hr of life. Mom placed baby in football position. She can easily express large drops of colostrum. Baby has a nice gape and latched easily. He did come off once but mom was able to re latch him quickly. She denies breast or nipple pain. Encouraged mom to continue latching baby on demand and post pump. She will call for help as needed.   Maternal Data    Feeding Feeding Type: Breast Fed  LATCH Score/Interventions Latch: Grasps breast easily, tongue down, lips flanged, rhythmical sucking. Intervention(s): Skin to skin Intervention(s): Adjust position;Breast compression  Audible Swallowing: Spontaneous and intermittent Intervention(s): Hand expression Intervention(s): Alternate breast massage  Type of Nipple: Everted at rest and after stimulation  Comfort (Breast/Nipple): Soft / non-tender     Hold (Positioning): Full assist, staff holds infant at breast Intervention(s): Support Pillows;Position options  LATCH Score: 8  Lactation Tools Discussed/Used     Consult Status Consult Status: Follow-up Date: 04/22/16 Follow-up type: In-patient    Denzil Hughes 04/21/2016, 6:45 PM

## 2016-04-21 NOTE — Lactation Note (Signed)
This note was copied from a baby's chart. Lactation Consultation Note  Patient Name: Boy Neeley Tomlinson [id number] Date: 04/21/2016 Reason for consult: Follow-up assessment Baby at 37 hr of life. Mom reports baby has not latched well since birth. She used DEBP x2 yesterday and did not get anything so she has not tried today. Left lactation phone number for mom to call to for latch help at next feeding. Encouraged her to use the DEBP after every feeding even if she is only getting drops, discussed the importance of stimulation. Discussed baby behavior, feeding frequency, baby belly size, voids, breast changes, and nipple care. She is aware of lactation services and support group. She will call as needed.     Maternal Data    Feeding Feeding Type: Bottle Fed - Formula  LATCH Score/Interventions                      Lactation Tools Discussed/Used     Consult Status Consult Status: Follow-up Date: 04/21/16 Follow-up type: In-patient    Denzil Hughes 04/21/2016, 4:58 PM

## 2016-04-22 MED ORDER — IBUPROFEN 600 MG PO TABS: 600.0000 mg | ORAL_TABLET | Freq: Four times a day (QID) | ORAL | 0 refills | Status: DC

## 2016-04-22 MED ORDER — GLYCERIN (LAXATIVE) 2.1 G RE SUPP
1.0000 | Freq: Once | RECTAL | Status: AC
  Administered 2016-04-22: 1 via RECTAL
  Filled 2016-04-22: qty 1

## 2016-04-22 MED ORDER — OXYCODONE-ACETAMINOPHEN 5-325 MG PO TABS: 1.0000 | ORAL_TABLET | Freq: Four times a day (QID) | ORAL | 0 refills | Status: DC | PRN

## 2016-04-22 NOTE — Lactation Note (Signed)
This note was copied from a baby's chart. Lactation Consultation Note Discussed report with Rn, baby is being bottle and breastfed.  Last LATCH score of "9".  Rn reports mom does not need LC visit at this time and has a plan in place and doing well.  LC to follow as needed.    Patient Name: Samantha Francis [id number] Date: 04/22/2016     Maternal Data    Feeding Feeding Type: Breast Fed Nipple Type: Regular Length of feed: 15 min  LATCH Score/Interventions Latch: Repeated attempts needed to sustain latch, nipple held in mouth throughout feeding, stimulation needed to elicit sucking reflex. Intervention(s): Skin to skin Intervention(s): Breast massage  Audible Swallowing: Spontaneous and intermittent Intervention(s): Skin to skin Intervention(s): Hand expression;Skin to skin  Type of Nipple: Everted at rest and after stimulation Intervention(s): Double electric pump  Comfort (Breast/Nipple): Soft / non-tender     Hold (Positioning): No assistance needed to correctly position infant at breast. Intervention(s): Skin to skin;Position options;Support Pillows  LATCH Score: 9  Lactation Tools Discussed/Used     Consult Status      [place], Justine Null 04/22/2016, 11:16 PM

## 2016-04-22 NOTE — Progress Notes (Signed)
Infant being placed on double phototherapy lights. Dr. Emmaline Life notified as she was writing discharge orders for patient. She stated she would remove discharge orders; however they were still written and ordered. Notified Dr. Shawna Orleans, who ordered to cancel discharge orders since patient day 2 post op. Will continue to monitor. Maxwell Caul, Leretha Dykes [place]

## 2016-04-22 NOTE — Progress Notes (Signed)
Post Partum Day 2  Subjective:  Samantha Francis is a 35 y.o. [id number] [redacted]w[redacted]d s/p rLTCS  No acute events overnight.  Pt denies problems with ambulating, voiding or po intake.  She denies nausea or vomiting.  Pain is well controlled.  She has had flatus. She has not had bowel movement.  Lochia Minimal.  Plan for birth control is bilateral tubal ligation.  Method of Feeding: Breast and Bottle   Objective: BP 116/67 (BP Location: Right Arm)   Pulse 82   Temp 98.3 F (36.8 C) (Oral)   Resp 18   Ht 5\' 2"  (1.575 m)   Wt 81.6 kg (180 lb)   LMP 07/30/2015 (Exact Date)   SpO2 100%   Breastfeeding? Unknown   BMI 32.92 kg/m   Physical Exam:  General: alert, cooperative and no distress Lochia:normal flow Abdomen: +BS, soft, nontender, fundus firm at/below umbilicus Uterine Fundus: firm,  DVT Evaluation: No evidence of DVT seen on physical exam. Extremities: no edema   Recent Labs  04/20/16 0105 04/20/16 0625  HGB 8.7* 7.9*  HCT 27.4* 25.2*    Assessment/Plan:  ASSESSMENT: Samantha Francis is a 35 y.o. G4P2013 [redacted]w[redacted]d pod #2 s/p rLTCS doing well.   Plan for discharge tomorrow   LOS: 2 days   [place] 04/22/2016, 8:51 AM    I have seen and examined this patient and agree the above assessment.  Respiratory effort normal, lochia appropriate, legs negative,  pain level normal.  CRESENZO-DISHMAN,Kathrina Crosley 04/26/2016 9:03 AM

## 2016-04-22 NOTE — Discharge Summary (Signed)
OB Discharge Summary     Patient Name: Samantha Francis DOB: [birth year] MRN: [id number]  Date of admission: 04/20/2016 Delivering MD: Donnamae Jude   Date of discharge: 04/22/2016  Admitting diagnosis: 84 WEEKS ROM  Intrauterine pregnancy: [redacted]w[redacted]d     Secondary diagnosis:  Active Problems:   Status post repeat low transverse cesarean section  Additional problems: Hx of Genital HSV, Anemia      Discharge diagnosis: Term Pregnancy Delivered                                                                                                Post partum procedures:postpartum tubal ligation  Augmentation: None  Complications: None  Hospital course:  Sceduled C/S   35 y.o. yo [id number] at [redacted]w[redacted]d was admitted to the hospital 04/20/2016 for scheduled cesarean section with the following indication:Elective Repeat.  Membrane Rupture Time/Date: 10:00 PM ,04/19/2016   Patient delivered a Viable infant.04/20/2016  Details of operation can be found in separate operative note.  Pateint had an uncomplicated postpartum course.  She is ambulating, tolerating a regular diet, passing flatus, and urinating well. Patient is discharged home in stable condition on  04/22/16          Physical exam Vitals:   04/21/16 0612 04/21/16 1845 04/21/16 2227 04/22/16 0608  BP: 110/66 130/74 123/69 116/67  Pulse: 65 68 80 82  Resp: 20 20 18 18   Temp: 98.5 F (36.9 C) 98.2 F (36.8 C) 98.3 F (36.8 C) 98.3 F (36.8 C)  TempSrc: Oral Oral Oral Oral  SpO2:   100%   Weight:      Height:       General: alert and cooperative Lochia: appropriate Uterine Fundus: firm Incision: Healing well with no significant drainage DVT Evaluation: No evidence of DVT seen on physical exam. Labs: Lab Results  Component Value Date   WBC 9.8 04/20/2016   HGB 7.9 (L) 04/20/2016   HCT 25.2 (L) 04/20/2016   MCV 77.1 (L) 04/20/2016   PLT 162 04/20/2016   No flowsheet data found.  Discharge instruction: per After Visit  Summary and "Baby and Me Booklet".  After visit meds:    Medication List    STOP taking these medications   calcium carbonate 500 MG chewable tablet Commonly known as:  TUMS - dosed in mg elemental calcium     TAKE these medications   aspirin EC 81 MG tablet Take 1 tablet (81 mg total) by mouth daily. Take after 12 weeks for prevention of preeclampsia later in pregnancy   docusate sodium 100 MG capsule Commonly known as:  COLACE Take 1 capsule (100 mg total) by mouth 2 (two) times daily as needed. What changed:  reasons to take this   ferrous sulfate 325 (65 FE) MG tablet Commonly known as:  FERROUSUL Take 1 tablet (325 mg total) by mouth 3 (three) times daily with meals.   ibuprofen 600 MG tablet Commonly known as:  ADVIL,MOTRIN Take 1 tablet (600 mg total) by mouth every 6 (six) hours.   oxyCODONE-acetaminophen 5-325 MG tablet Commonly known as:  PERCOCET/ROXICET Take 1  tablet by mouth every 6 (six) hours as needed (pain scale 4-7).   prenatal multivitamin Tabs tablet Take 1 tablet by mouth daily at 12 noon.   valACYclovir 1000 MG tablet Commonly known as:  VALTREX Take 1 tablet (1,000 mg total) by mouth daily.       Diet: routine diet  Activity: Advance as tolerated. Pelvic rest for 6 weeks.   Outpatient follow up:6 weeks Follow up Appt: Future Appointments Date Time Provider [place]  06/06/2016 1:20 PM Donnamae Jude, MD [facility]   Follow up Visit:No Follow-up on file.  Postpartum contraception: Tubal Ligation  Newborn Data: Live born female  Birth Weight: 5 lb 13 oz (2635 g) APGAR: 10, 10  Baby Feeding: Bottle and Breast Disposition:home with mother  Anemia noted - hgb 8.7> 7.9 - home with Iron TID   04/22/2016 Asiyah Criss Rosales, MD   I have seen and examined this patient and agree the above assessment.  Respiratory effort normal, lochia appropriate, legs negative,  pain level normal.  CRESENZO-DISHMAN,Witney Huie 04/22/2016 8:02  AM

## 2016-04-22 NOTE — Plan of Care (Signed)
Problem: Life Cycle: Goal: Chance of risk for complications during the postpartum period will decrease Outcome: Completed/Met Date Met: 04/22/16 Encouraged frequent emptying of bladder  Problem: Bowel/Gastric: Goal: Gastrointestinal status will improve Outcome: Progressing Patient given suppository per patient request. Patient stated she feels very gassy and feels as if she needs to have a bowel movement but cannot.

## 2016-04-22 NOTE — Plan of Care (Signed)
Problem: Education: Goal: Knowledge of condition will improve Outcome: Progressing Educated patient regarding pumping, breast care and how often to feed baby who is under double bili lights.

## 2016-04-23 NOTE — Discharge Instructions (Signed)
Postpartum Care After Cesarean Delivery After you deliver your newborn (postpartum period), the usual stay in the hospital is 24-72 hours. If there were problems with your labor or delivery, or if you have other medical problems, you might be in the hospital longer.  While you are in the hospital, you will receive help and instructions on how to care for yourself and your newborn during the postpartum period.  While you are in the hospital: It is normal for you to have pain or discomfort from the incision in your abdomen. Be sure to tell your nurses when you are having pain, where the pain is located, and what makes the pain worse. If you are breastfeeding, you may feel uncomfortable contractions of your uterus for a couple of weeks. This is normal. The contractions help your uterus get back to normal size. It is normal to have some bleeding after delivery. For the first 1-3 days after delivery, the flow is red and the amount may be similar to a period. It is common for the flow to start and stop. In the first few days, you may pass some small clots. Let your nurses know if you begin to pass large clots or your flow increases. Do not  flush blood clots down the toilet before having the nurse look at them. During the next 3-10 days after delivery, your flow should become more watery and pink or brown-tinged in color. Ten to fourteen days after delivery, your flow should be a small amount of yellowish-white discharge. The amount of your flow will decrease over the first few weeks after delivery. Your flow may stop in 6-8 weeks. Most women have had their flow stop by 12 weeks after delivery. You should change your sanitary pads frequently. Wash your hands thoroughly with soap and water for at least 20 seconds after changing pads, using the toilet, or before holding or feeding your newborn. Your intravenous (IV) tubing will be removed when you are drinking enough fluids. The urine drainage tube (urinary  catheter) that was inserted before delivery may be removed within 6-8 hours after delivery or when feeling returns to your legs. You should feel like you need to empty your bladder within the first 6-8 hours after the catheter has been removed. In case you become weak, lightheaded, or faint, call your nurse before you get out of bed for the first time and before you take a shower for the first time. Within the first few days after delivery, your breasts may begin to feel tender and full. This is called engorgement. Breast tenderness usually goes away within 48-72 hours after engorgement occurs. You may also notice milk leaking from your breasts. If you are not breastfeeding, do not stimulate your breasts. Breast stimulation can make your breasts produce more milk. Spending as much time as possible with your newborn is very important. During this time, you and your newborn can feel close and get to know each other. Having your newborn stay in your room (rooming in) will help to strengthen the bond with your newborn. It will give you time to get to know your newborn and become comfortable caring for your newborn. Your hormones change after delivery. Sometimes the hormone changes can temporarily cause you to feel sad or tearful. These feelings should not last more than a few days. If these feelings last longer than that, you should talk to your caregiver. If desired, talk to your caregiver about methods of family planning or contraception. Talk to your caregiver  about immunizations. Your caregiver may want you to have the following immunizations before leaving the hospital: Tetanus, diphtheria, and pertussis (Tdap) or tetanus and diphtheria (Td) immunization. It is very important that you and your family (including grandparents) or others caring for your newborn are up-to-date with the Tdap or Td immunizations. The Tdap or Td immunization can help protect your newborn from getting ill. Rubella  immunization. Varicella (chickenpox) immunization. Influenza immunization. You should receive this annual immunization if you did not receive the immunization during your pregnancy.   This information is not intended to replace advice given to you by your health care provider. Make sure you discuss any questions you have with your health care provider.   Document Released: 05/09/2012 Document Reviewed: 05/09/2012 Elsevier Interactive Patient Education 2016 [employer]. Iron-Rich Diet  Iron is a mineral that helps your body to produce hemoglobin. Hemoglobin is a protein in your red blood cells that carries oxygen to your body's tissues. Eating too little iron may cause you to feel weak and tired, and it can increase your risk for infection. Eating enough iron is necessary for your body's metabolism, muscle function, and nervous system. Iron is naturally found in many foods. It can also be added to foods or fortified in foods. There are two types of dietary iron:  Heme iron. Heme iron is absorbed by the body more easily than nonheme iron. Heme iron is found in meat, poultry, and fish.  Nonheme iron. Nonheme iron is found in dietary supplements, iron-fortified grains, beans, and vegetables. You may need to follow an iron-rich diet if:  You have been diagnosed with iron deficiency or iron-deficiency anemia.  You have a condition that prevents you from absorbing dietary iron, such as:  Infection in your intestines.  Celiac disease. This involves long-lasting (chronic) inflammation of your intestines.  You do not eat enough iron.  You eat a diet that is high in foods that impair iron absorption.  You have lost a lot of blood.  You have heavy bleeding during your menstrual cycle.  You are pregnant. WHAT IS MY PLAN? Your health care provider may help you to determine how much iron you need per day based on your condition. Generally, when a person consumes sufficient amounts of iron in  the diet, the following iron needs are met:  Men.  81-58 years old: 11 mg per day.  98-20 years old: 8 mg per day.  Women.   49-75 years old: 15 mg per day.  53-27 years old: 18 mg per day.  Over 55 years old: 8 mg per day.  Pregnant women: 27 mg per day.  Breastfeeding women: 9 mg per day. WHAT DO I NEED TO KNOW ABOUT AN IRON-RICH DIET?  Eat fresh fruits and vegetables that are high in vitamin C along with foods that are high in iron. This will help increase the amount of iron that your body absorbs from food, especially with foods containing nonheme iron. Foods that are high in vitamin C include oranges, peppers, tomatoes, and mango.  Take iron supplements only as directed by your health care provider. Overdose of iron can be life-threatening. If you were prescribed iron supplements, take them with orange juice or a vitamin C supplement.  Cook foods in pots and pans that are made from iron.   Eat nonheme iron-containing foods alongside foods that are high in heme iron. This helps to improve your iron absorption.   Certain foods and drinks contain compounds that impair iron absorption.  Avoid eating these foods in the same meal as iron-rich foods or with iron supplements. These include:  Coffee, black tea, and red wine.  Milk, dairy products, and foods that are high in calcium.  Beans, soybeans, and peas.  Whole grains.  When eating foods that contain both nonheme iron and compounds that impair iron absorption, follow these tips to absorb iron better.   Soak beans overnight before cooking.  Soak whole grains overnight and drain them before using.  Ferment flours before baking, such as using yeast in bread dough. WHAT FOODS CAN I EAT? Grains Iron-fortified breakfast cereal. Iron-fortified whole-wheat bread. Enriched rice. Sprouted grains. Vegetables Spinach. Potatoes with skin. Green peas. Broccoli. Red and green bell peppers. Fermented  vegetables. Fruits Prunes. Raisins. Oranges. Strawberries. Mango. Grapefruit. Meats and Other Protein Sources Beef liver. Oysters. Beef. Shrimp. [place]. Chicken. [place]. Sardines. Chickpeas. Nuts. Tofu. Beverages Tomato juice. Fresh orange juice. Prune juice. Hibiscus tea. Fortified instant breakfast shakes. Condiments Tahini. Fermented soy sauce. Sweets and Desserts Black-strap molasses.  Other Wheat germ. The items listed above may not be a complete list of recommended foods or beverages. Contact your dietitian for more options. WHAT FOODS ARE NOT RECOMMENDED? Grains Whole grains. Bran cereal. Bran flour. Oats. Vegetables Artichokes. Brussels sprouts. Kale. Fruits Blueberries. Raspberries. Strawberries. Figs. Meats and Other Protein Sources Soybeans. Products made from soy protein. Dairy Milk. Cream. Cheese. Yogurt. Cottage cheese. Beverages Coffee. Black tea. Red wine. Sweets and Desserts Cocoa. Chocolate. Ice cream. Other Basil. Oregano. Parsley. The items listed above may not be a complete list of foods and beverages to avoid. Contact your dietitian for more information.   This information is not intended to replace advice given to you by your health care provider. Make sure you discuss any questions you have with your health care provider.   Document Released: 03/29/2005 Document Revised: 09/05/2014 Document Reviewed: 03/12/2014 Elsevier Interactive Patient Education 2016 [employer].  Postpartum Care After Cesarean Delivery After you deliver your newborn (postpartum period), the usual stay in the hospital is 24-72 hours. If there were problems with your labor or delivery, or if you have other medical problems, you might be in the hospital longer.  While you are in the hospital, you will receive help and instructions on how to care for yourself and your newborn during the postpartum period.  While you are in the hospital:  It is normal for you to have  pain or discomfort from the incision in your abdomen. Be sure to tell your nurses when you are having pain, where the pain is located, and what makes the pain worse.  If you are breastfeeding, you may feel uncomfortable contractions of your uterus for a couple of weeks. This is normal. The contractions help your uterus get back to normal size.  It is normal to have some bleeding after delivery.  For the first 1-3 days after delivery, the flow is red and the amount may be similar to a period.  It is common for the flow to start and stop.  In the first few days, you may pass some small clots. Let your nurses know if you begin to pass large clots or your flow increases.  Do not  flush blood clots down the toilet before having the nurse look at them.  During the next 3-10 days after delivery, your flow should become more watery and pink or brown-tinged in color.  Ten to fourteen days after delivery, your flow should be a small amount of yellowish-white discharge.  The amount of your flow will decrease over the first few weeks after delivery. Your flow may stop in 6-8 weeks. Most women have had their flow stop by 12 weeks after delivery.  You should change your sanitary pads frequently.  Wash your hands thoroughly with soap and water for at least 20 seconds after changing pads, using the toilet, or before holding or feeding your newborn.  Your intravenous (IV) tubing will be removed when you are drinking enough fluids.  The urine drainage tube (urinary catheter) that was inserted before delivery may be removed within 6-8 hours after delivery or when feeling returns to your legs. You should feel like you need to empty your bladder within the first 6-8 hours after the catheter has been removed.  In case you become weak, lightheaded, or faint, call your nurse before you get out of bed for the first time and before you take a shower for the first time.  Within the first few days after delivery,  your breasts may begin to feel tender and full. This is called engorgement. Breast tenderness usually goes away within 48-72 hours after engorgement occurs. You may also notice milk leaking from your breasts. If you are not breastfeeding, do not stimulate your breasts. Breast stimulation can make your breasts produce more milk.  Spending as much time as possible with your newborn is very important. During this time, you and your newborn can feel close and get to know each other. Having your newborn stay in your room (rooming in) will help to strengthen the bond with your newborn. It will give you time to get to know your newborn and become comfortable caring for your newborn.  Your hormones change after delivery. Sometimes the hormone changes can temporarily cause you to feel sad or tearful. These feelings should not last more than a few days. If these feelings last longer than that, you should talk to your caregiver.  If desired, talk to your caregiver about methods of family planning or contraception.  Talk to your caregiver about immunizations. Your caregiver may want you to have the following immunizations before leaving the hospital:  Tetanus, diphtheria, and pertussis (Tdap) or tetanus and diphtheria (Td) immunization. It is very important that you and your family (including grandparents) or others caring for your newborn are up-to-date with the Tdap or Td immunizations. The Tdap or Td immunization can help protect your newborn from getting ill.  Rubella immunization.  Varicella (chickenpox) immunization.  Influenza immunization. You should receive this annual immunization if you did not receive the immunization during your pregnancy.   This information is not intended to replace advice given to you by your health care provider. Make sure you discuss any questions you have with your health care provider.   Document Released: 05/09/2012 Document Reviewed: 05/09/2012 Elsevier Interactive  Patient Education [employer].

## 2016-04-23 NOTE — Discharge Summary (Signed)
OB Discharge Summary     Patient Name: Samantha Francis DOB: [birth year] MRN: [id number]  Date of admission: 04/20/2016 Delivering MD: Samantha Francis   Date of discharge: 04/23/2016  Admitting diagnosis: 68 WEEKS ROM  Intrauterine pregnancy: [redacted]w[redacted]d     Secondary diagnosis:  Active Problems:   Status post repeat low transverse cesarean section Undesired fertility Additional problems: PROM     Discharge diagnosis: Term Pregnancy Delivered                                                                                                Post partum procedures: bilateral tubal ligation  Augmentation: n/a  Complications: None  Hospital course:  Sceduled C/S   35 y.o. yo [id number] at [redacted]w[redacted]d was admitted to the hospital 04/20/2016 for scheduled cesarean section with the following indication:Elective Repeat.  Membrane Rupture Time/Date: 10:00 PM ,04/19/2016   Patient delivered a Viable infant.04/20/2016  Details of operation can be found in separate operative note.  Pateint had an uncomplicated postpartum course.  She is ambulating, tolerating a regular diet, passing flatus, and urinating well. Patient is discharged home in stable condition on  04/23/16          Physical exam Vitals:   04/22/16 0608 04/22/16 1730 04/22/16 2301 04/23/16 0558  BP: 116/67 126/83 136/79 123/77  Pulse: 82 84 80 72  Resp: 18 18 18 18   Temp: 98.3 F (36.8 C) 98.4 F (36.9 C) 99.1 F (37.3 C) 98.3 F (36.8 C)  TempSrc: Oral Oral Oral Oral  SpO2:      Weight:      Height:       General: alert, cooperative and no distress Lochia: appropriate Uterine Fundus: firm Incision: Healing well with no significant drainage. Honeycomb dressing moist> dressing changed DVT Evaluation: No evidence of DVT seen on physical exam. Labs: Lab Results  Component Value Date   WBC 9.8 04/20/2016   HGB 7.9 (L) 04/20/2016   HCT 25.2 (L) 04/20/2016   MCV 77.1 (L) 04/20/2016   PLT 162 04/20/2016   No flowsheet data  found.  Discharge instruction: per After Visit Summary and "Baby and Me Booklet".  After visit meds:    Medication List    STOP taking these medications   calcium carbonate 500 MG chewable tablet Commonly known as:  TUMS - dosed in mg elemental calcium     TAKE these medications   aspirin EC 81 MG tablet Take 1 tablet (81 mg total) by mouth daily. Take after 12 weeks for prevention of preeclampsia later in pregnancy   docusate sodium 100 MG capsule Commonly known as:  COLACE Take 1 capsule (100 mg total) by mouth 2 (two) times daily as needed. What changed:  reasons to take this   ferrous sulfate 325 (65 FE) MG tablet Commonly known as:  FERROUSUL Take 1 tablet (325 mg total) by mouth 3 (three) times daily with meals.   ibuprofen 600 MG tablet Commonly known as:  ADVIL,MOTRIN Take 1 tablet (600 mg total) by mouth every 6 (six) hours.   oxyCODONE-acetaminophen 5-325 MG tablet Commonly known as:  PERCOCET/ROXICET Take 1 tablet by mouth every 6 (six) hours as needed (pain scale 4-7).   prenatal multivitamin Tabs tablet Take 1 tablet by mouth daily at 12 noon.   valACYclovir 1000 MG tablet Commonly known as:  VALTREX Take 1 tablet (1,000 mg total) by mouth daily.       Diet: routine diet  Activity: Advance as tolerated. Pelvic rest for 6 weeks.   Outpatient follow up:6 weeks Follow up Appt:Future Appointments Date Time Provider [place]  06/06/2016 1:20 PM Samantha Jude, MD [facility]   Follow up Visit:No Follow-up on file.  Postpartum contraception: Tubal Ligation  Newborn Data: Live born female  Birth Weight: 5 lb 13 oz (2635 g) APGAR: 10, 10  Baby Feeding: Breast Disposition:home with mother   04/23/2016 Samantha Francis, CNM

## 2016-04-23 NOTE — Progress Notes (Signed)
Honey comb dressing changed per order

## 2016-04-23 NOTE — Lactation Note (Signed)
This note was copied from a baby's chart. Lactation Consultation Note Mother is leaning toward formula feeding. Explained to her and her RN that if she stops BF formula will be changed to Similac 19 cal.  RN to verify with ped.  Patient Name: Samantha Francis [id number] Date: 04/23/2016 Reason for consult: Follow-up assessment   Maternal Data    Feeding Feeding Type: Breast Milk with Formula added Nipple Type: Slow - flow Length of feed: 10 min  LATCH Score/Interventions                      Lactation Tools Discussed/Used     Consult Status Consult Status: Complete    Van Clines 04/23/2016, 10:24 AM

## 2016-04-27 ENCOUNTER — Encounter: Payer: 59 | Admitting: Certified Nurse Midwife

## 2016-04-28 ENCOUNTER — Encounter (HOSPITAL_COMMUNITY): Admission: RE | Admit: 2016-04-28 | Payer: 59 | Source: Ambulatory Visit

## 2016-04-29 ENCOUNTER — Inpatient Hospital Stay (HOSPITAL_COMMUNITY): Admission: RE | Admit: 2016-04-29 | Payer: 59 | Source: Ambulatory Visit | Admitting: Obstetrics & Gynecology

## 2016-04-29 ENCOUNTER — Encounter (HOSPITAL_COMMUNITY): Admission: RE | Payer: Self-pay | Source: Ambulatory Visit

## 2016-04-29 SURGERY — Surgical Case
Anesthesia: Regional

## 2016-06-06 ENCOUNTER — Ambulatory Visit (INDEPENDENT_AMBULATORY_CARE_PROVIDER_SITE_OTHER): Payer: 59 | Admitting: Family Medicine

## 2016-06-06 NOTE — Progress Notes (Signed)
Subjective:     Samantha Francis is a 35 y.o. female who presents for a postpartum visit. She is 6 weeks postpartum following a low cervical transverse Cesarean section. I have fully reviewed the prenatal and intrapartum course. The delivery was at 73 gestational weeks. Outcome: repeat cesarean section, low transverse incision. Anesthesia: spinal. Postpartum course has been normal. Baby's course has been normal. Baby is feeding by bottle - Similac Advance. Bleeding no bleeding. Bowel function is normal. Bladder function is normal. Patient is sexually active. Contraception method is tubal ligation. Postpartum depression screening: negative.  The following portions of the patient's history were reviewed and updated as appropriate: allergies, current medications, past family history, past medical history, past social history, past surgical history and problem list.  Review of Systems Pertinent items noted in HPI and remainder of comprehensive ROS otherwise negative.   Objective:    There were no vitals taken for this visit.  General:  alert, cooperative and appears stated age  Abdomen: soft, non-tender; bowel sounds normal; no masses,  no organomegaly  Incision is well healed.        Assessment:    Normal postpartum exam. Pap smear not done at today's visit.   Plan:    1. Contraception: tubal ligation 2. Pap due 07/2017 3. Follow up in: 18 months or as needed.

## 2016-06-06 NOTE — Patient Instructions (Signed)
Preventive Care for Adults, Female A healthy lifestyle and preventive care can promote health and wellness. Preventive health guidelines for women include the following key practices.  A routine yearly physical is a good way to check with your health care provider about your health and preventive screening. It is a chance to share any concerns and updates on your health and to receive a thorough exam.  Visit your dentist for a routine exam and preventive care every 6 months. Brush your teeth twice a day and floss once a day. Good oral hygiene prevents tooth decay and gum disease.  The frequency of eye exams is based on your age, health, family medical history, use of contact lenses, and other factors. Follow your health care provider's recommendations for frequency of eye exams.  Eat a healthy diet. Foods like vegetables, fruits, whole grains, low-fat dairy products, and lean protein foods contain the nutrients you need without too many calories. Decrease your intake of foods high in solid fats, added sugars, and salt. Eat the right amount of calories for you.Get information about a proper diet from your health care provider, if necessary.  Regular physical exercise is one of the most important things you can do for your health. Most adults should get at least 150 minutes of moderate-intensity exercise (any activity that increases your heart rate and causes you to sweat) each week. In addition, most adults need muscle-strengthening exercises on 2 or more days a week.  Maintain a healthy weight. The body mass index (BMI) is a screening tool to identify possible weight problems. It provides an estimate of body fat based on height and weight. Your health care provider can find your BMI and can help you achieve or maintain a healthy weight.For adults 20 years and older:  A BMI below 18.5 is considered underweight.  A BMI of 18.5 to 24.9 is normal.  A BMI of 25 to 29.9 is considered overweight.  A  BMI of 30 and above is considered obese.  Maintain normal blood lipids and cholesterol levels by exercising and minimizing your intake of saturated fat. Eat a balanced diet with plenty of fruit and vegetables. Blood tests for lipids and cholesterol should begin at age 45 and be repeated every 5 years. If your lipid or cholesterol levels are high, you are over 50, or you are at high risk for heart disease, you may need your cholesterol levels checked more frequently.Ongoing high lipid and cholesterol levels should be treated with medicines if diet and exercise are not working.  If you smoke, find out from your health care provider how to quit. If you do not use tobacco, do not start.  Lung cancer screening is recommended for adults aged 45-80 years who are at high risk for developing lung cancer because of a history of smoking. A yearly low-dose CT scan of the lungs is recommended for people who have at least a 30-pack-year history of smoking and are a current smoker or have quit within the past 15 years. A pack year of smoking is smoking an average of 1 pack of cigarettes a day for 1 year (for example: 1 pack a day for 30 years or 2 packs a day for 15 years). Yearly screening should continue until the smoker has stopped smoking for at least 15 years. Yearly screening should be stopped for people who develop a health problem that would prevent them from having lung cancer treatment.  If you are pregnant, do not drink alcohol. If you are  breastfeeding, be very cautious about drinking alcohol. If you are not pregnant and choose to drink alcohol, do not have more than 1 drink per day. One drink is considered to be 12 ounces (355 mL) of beer, 5 ounces (148 mL) of wine, or 1.5 ounces (44 mL) of liquor.  Avoid use of street drugs. Do not share needles with anyone. Ask for help if you need support or instructions about stopping the use of drugs.  High blood pressure causes heart disease and increases the risk  of stroke. Your blood pressure should be checked at least every 1 to 2 years. Ongoing high blood pressure should be treated with medicines if weight loss and exercise do not work.  If you are 55-79 years old, ask your health care provider if you should take aspirin to prevent strokes.  Diabetes screening is done by taking a blood sample to check your blood glucose level after you have not eaten for a certain period of time (fasting). If you are not overweight and you do not have risk factors for diabetes, you should be screened once every 3 years starting at age 45. If you are overweight or obese and you are 40-70 years of age, you should be screened for diabetes every year as part of your cardiovascular risk assessment.  Breast cancer screening is essential preventive care for women. You should practice "breast self-awareness." This means understanding the normal appearance and feel of your breasts and may include breast self-examination. Any changes detected, no matter how small, should be reported to a health care provider. Women in their 20s and 30s should have a clinical breast exam (CBE) by a health care provider as part of a regular health exam every 1 to 3 years. After age 40, women should have a CBE every year. Starting at age 40, women should consider having a mammogram (breast X-ray test) every year. Women who have a family history of breast cancer should talk to their health care provider about genetic screening. Women at a high risk of breast cancer should talk to their health care providers about having an MRI and a mammogram every year.  Breast cancer gene (BRCA)-related cancer risk assessment is recommended for women who have family members with BRCA-related cancers. BRCA-related cancers include breast, ovarian, tubal, and peritoneal cancers. Having family members with these cancers may be associated with an increased risk for harmful changes (mutations) in the breast cancer genes BRCA1 and  BRCA2. Results of the assessment will determine the need for genetic counseling and BRCA1 and BRCA2 testing.  Your health care provider may recommend that you be screened regularly for cancer of the pelvic organs (ovaries, uterus, and vagina). This screening involves a pelvic examination, including checking for microscopic changes to the surface of your cervix (Pap test). You may be encouraged to have this screening done every 3 years, beginning at age 21.  For women ages 30-65, health care providers may recommend pelvic exams and Pap testing every 3 years, or they may recommend the Pap and pelvic exam, combined with testing for human papilloma virus (HPV), every 5 years. Some types of HPV increase your risk of cervical cancer. Testing for HPV may also be done on women of any age with unclear Pap test results.  Other health care providers may not recommend any screening for nonpregnant women who are considered low risk for pelvic cancer and who do not have symptoms. Ask your health care provider if a screening pelvic exam is right for   you.  If you have had past treatment for cervical cancer or a condition that could lead to cancer, you need Pap tests and screening for cancer for at least 20 years after your treatment. If Pap tests have been discontinued, your risk factors (such as having a new sexual partner) need to be reassessed to determine if screening should resume. Some women have medical problems that increase the chance of getting cervical cancer. In these cases, your health care provider may recommend more frequent screening and Pap tests.  Colorectal cancer can be detected and often prevented. Most routine colorectal cancer screening begins at the age of 50 years and continues through age 75 years. However, your health care provider may recommend screening at an earlier age if you have risk factors for colon cancer. On a yearly basis, your health care provider may provide home test kits to check  for hidden blood in the stool. Use of a small camera at the end of a tube, to directly examine the colon (sigmoidoscopy or colonoscopy), can detect the earliest forms of colorectal cancer. Talk to your health care provider about this at age 50, when routine screening begins. Direct exam of the colon should be repeated every 5-10 years through age 75 years, unless early forms of precancerous polyps or small growths are found.  People who are at an increased risk for hepatitis B should be screened for this virus. You are considered at high risk for hepatitis B if:  You were born in a country where hepatitis B occurs often. Talk with your health care provider about which countries are considered high risk.  Your parents were born in a high-risk country and you have not received a shot to protect against hepatitis B (hepatitis B vaccine).  You have HIV or AIDS.  You use needles to inject street drugs.  You live with, or have sex with, someone who has hepatitis B.  You get hemodialysis treatment.  You take certain medicines for conditions like cancer, organ transplantation, and autoimmune conditions.  Hepatitis C blood testing is recommended for all people born from 1945 through 1965 and any individual with known risks for hepatitis C.  Practice safe sex. Use condoms and avoid high-risk sexual practices to reduce the spread of sexually transmitted infections (STIs). STIs include gonorrhea, chlamydia, syphilis, trichomonas, herpes, HPV, and human immunodeficiency virus (HIV). Herpes, HIV, and HPV are viral illnesses that have no cure. They can result in disability, cancer, and death.  You should be screened for sexually transmitted illnesses (STIs) including gonorrhea and chlamydia if:  You are sexually active and are younger than 24 years.  You are older than 24 years and your health care provider tells you that you are at risk for this type of infection.  Your sexual activity has changed  since you were last screened and you are at an increased risk for chlamydia or gonorrhea. Ask your health care provider if you are at risk.  If you are at risk of being infected with HIV, it is recommended that you take a prescription medicine daily to prevent HIV infection. This is called preexposure prophylaxis (PrEP). You are considered at risk if:  You are sexually active and do not regularly use condoms or know the HIV status of your partner(s).  You take drugs by injection.  You are sexually active with a partner who has HIV.  Talk with your health care provider about whether you are at high risk of being infected with HIV. If   you choose to begin PrEP, you should first be tested for HIV. You should then be tested every 3 months for as long as you are taking PrEP.  Osteoporosis is a disease in which the bones lose minerals and strength with aging. This can result in serious bone fractures or breaks. The risk of osteoporosis can be identified using a bone density scan. Women ages 67 years and over and women at risk for fractures or osteoporosis should discuss screening with their health care providers. Ask your health care provider whether you should take a calcium supplement or vitamin D to reduce the rate of osteoporosis.  Menopause can be associated with physical symptoms and risks. Hormone replacement therapy is available to decrease symptoms and risks. You should talk to your health care provider about whether hormone replacement therapy is right for you.  Use sunscreen. Apply sunscreen liberally and repeatedly throughout the day. You should seek shade when your shadow is shorter than you. Protect yourself by wearing long sleeves, pants, a wide-brimmed hat, and sunglasses year round, whenever you are outdoors.  Once a month, do a whole body skin exam, using a mirror to look at the skin on your back. Tell your health care provider of new moles, moles that have irregular borders, moles that  are larger than a pencil eraser, or moles that have changed in shape or color.  Stay current with required vaccines (immunizations).  Influenza vaccine. All adults should be immunized every year.  Tetanus, diphtheria, and acellular pertussis (Td, Tdap) vaccine. Pregnant women should receive 1 dose of Tdap vaccine during each pregnancy. The dose should be obtained regardless of the length of time since the last dose. Immunization is preferred during the 27th-36th week of gestation. An adult who has not previously received Tdap or who does not know her vaccine status should receive 1 dose of Tdap. This initial dose should be followed by tetanus and diphtheria toxoids (Td) booster doses every 10 years. Adults with an unknown or incomplete history of completing a 3-dose immunization series with Td-containing vaccines should begin or complete a primary immunization series including a Tdap dose. Adults should receive a Td booster every 10 years.  Varicella vaccine. An adult without evidence of immunity to varicella should receive 2 doses or a second dose if she has previously received 1 dose. Pregnant females who do not have evidence of immunity should receive the first dose after pregnancy. This first dose should be obtained before leaving the health care facility. The second dose should be obtained 4-8 weeks after the first dose.  Human papillomavirus (HPV) vaccine. Females aged 13-26 years who have not received the vaccine previously should obtain the 3-dose series. The vaccine is not recommended for use in pregnant females. However, pregnancy testing is not needed before receiving a dose. If a female is found to be pregnant after receiving a dose, no treatment is needed. In that case, the remaining doses should be delayed until after the pregnancy. Immunization is recommended for any person with an immunocompromised condition through the age of 61 years if she did not get any or all doses earlier. During the  3-dose series, the second dose should be obtained 4-8 weeks after the first dose. The third dose should be obtained 24 weeks after the first dose and 16 weeks after the second dose.  Zoster vaccine. One dose is recommended for adults aged 30 years or older unless certain conditions are present.  Measles, mumps, and rubella (MMR) vaccine. Adults born  before 1957 generally are considered immune to measles and mumps. Adults born in 1957 or later should have 1 or more doses of MMR vaccine unless there is a contraindication to the vaccine or there is laboratory evidence of immunity to each of the three diseases. A routine second dose of MMR vaccine should be obtained at least 28 days after the first dose for students attending postsecondary schools, health care workers, or international travelers. People who received inactivated measles vaccine or an unknown type of measles vaccine during 1963-1967 should receive 2 doses of MMR vaccine. People who received inactivated mumps vaccine or an unknown type of mumps vaccine before 1979 and are at high risk for mumps infection should consider immunization with 2 doses of MMR vaccine. For females of childbearing age, rubella immunity should be determined. If there is no evidence of immunity, females who are not pregnant should be vaccinated. If there is no evidence of immunity, females who are pregnant should delay immunization until after pregnancy. Unvaccinated health care workers born before 1957 who lack laboratory evidence of measles, mumps, or rubella immunity or laboratory confirmation of disease should consider measles and mumps immunization with 2 doses of MMR vaccine or rubella immunization with 1 dose of MMR vaccine.  Pneumococcal 13-valent conjugate (PCV13) vaccine. When indicated, a person who is uncertain of his immunization history and has no record of immunization should receive the PCV13 vaccine. All adults 65 years of age and older should receive this  vaccine. An adult aged 19 years or older who has certain medical conditions and has not been previously immunized should receive 1 dose of PCV13 vaccine. This PCV13 should be followed with a dose of pneumococcal polysaccharide (PPSV23) vaccine. Adults who are at high risk for pneumococcal disease should obtain the PPSV23 vaccine at least 8 weeks after the dose of PCV13 vaccine. Adults older than 37 years of age who have normal immune system function should obtain the PPSV23 vaccine dose at least 1 year after the dose of PCV13 vaccine.  Pneumococcal polysaccharide (PPSV23) vaccine. When PCV13 is also indicated, PCV13 should be obtained first. All adults aged 65 years and older should be immunized. An adult younger than age 65 years who has certain medical conditions should be immunized. Any person who resides in a nursing home or long-term care facility should be immunized. An adult smoker should be immunized. People with an immunocompromised condition and certain other conditions should receive both PCV13 and PPSV23 vaccines. People with human immunodeficiency virus (HIV) infection should be immunized as soon as possible after diagnosis. Immunization during chemotherapy or radiation therapy should be avoided. Routine use of PPSV23 vaccine is not recommended for American Indians, [place] Natives, or people younger than 65 years unless there are medical conditions that require PPSV23 vaccine. When indicated, people who have unknown immunization and have no record of immunization should receive PPSV23 vaccine. One-time revaccination 5 years after the first dose of PPSV23 is recommended for people aged 19-64 years who have chronic kidney failure, nephrotic syndrome, asplenia, or immunocompromised conditions. People who received 1-2 doses of PPSV23 before age 65 years should receive another dose of PPSV23 vaccine at age 65 years or later if at least 5 years have passed since the previous dose. Doses of PPSV23 are not  needed for people immunized with PPSV23 at or after age 65 years.  Meningococcal vaccine. Adults with asplenia or persistent complement component deficiencies should receive 2 doses of quadrivalent meningococcal conjugate (MenACWY-D) vaccine. The doses should be obtained   at least 2 months apart. Microbiologists working with certain meningococcal bacteria, [place] recruits, people at risk during an outbreak, and people who travel to or live in countries with a high rate of meningitis should be immunized. A first-year college student up through age 34 years who is living in a residence hall should receive a dose if she did not receive a dose on or after her 16th birthday. Adults who have certain high-risk conditions should receive one or more doses of vaccine.  Hepatitis A vaccine. Adults who wish to be protected from this disease, have certain high-risk conditions, work with hepatitis A-infected animals, work in hepatitis A research labs, or travel to or work in countries with a high rate of hepatitis A should be immunized. Adults who were previously unvaccinated and who anticipate close contact with an international adoptee during the first 60 days after arrival in the [place] from a country with a high rate of hepatitis A should be immunized.  Hepatitis B vaccine. Adults who wish to be protected from this disease, have certain high-risk conditions, may be exposed to blood or other infectious body fluids, are household contacts or sex partners of hepatitis B positive people, are clients or workers in certain care facilities, or travel to or work in countries with a high rate of hepatitis B should be immunized.  Haemophilus influenzae type b (Hib) vaccine. A previously unvaccinated person with asplenia or sickle cell disease or having a scheduled splenectomy should receive 1 dose of Hib vaccine. Regardless of previous immunization, a recipient of a hematopoietic stem cell transplant should receive a  3-dose series 6-12 months after her successful transplant. Hib vaccine is not recommended for adults with HIV infection. Preventive Services / Frequency Ages 35 to 4 years  Blood pressure check.** / Every 3-5 years.  Lipid and cholesterol check.** / Every 5 years beginning at age 60.  Clinical breast exam.** / Every 3 years for women in their 71s and 10s.  BRCA-related cancer risk assessment.** / For women who have family members with a BRCA-related cancer (breast, ovarian, tubal, or peritoneal cancers).  Pap test.** / Every 2 years from ages 76 through 26. Every 3 years starting at age 61 through age 76 or 93 with a history of 3 consecutive normal Pap tests.  HPV screening.** / Every 3 years from ages 37 through ages 60 to 51 with a history of 3 consecutive normal Pap tests.  Hepatitis C blood test.** / For any individual with known risks for hepatitis C.  Skin self-exam. / Monthly.  Influenza vaccine. / Every year.  Tetanus, diphtheria, and acellular pertussis (Tdap, Td) vaccine.** / Consult your health care provider. Pregnant women should receive 1 dose of Tdap vaccine during each pregnancy. 1 dose of Td every 10 years.  Varicella vaccine.** / Consult your health care provider. Pregnant females who do not have evidence of immunity should receive the first dose after pregnancy.  HPV vaccine. / 3 doses over 6 months, if 93 and younger. The vaccine is not recommended for use in pregnant females. However, pregnancy testing is not needed before receiving a dose.  Measles, mumps, rubella (MMR) vaccine.** / You need at least 1 dose of MMR if you were born in 1957 or later. You may also need a 2nd dose. For females of childbearing age, rubella immunity should be determined. If there is no evidence of immunity, females who are not pregnant should be vaccinated. If there is no evidence of immunity, females who are  pregnant should delay immunization until after pregnancy.  Pneumococcal  13-valent conjugate (PCV13) vaccine.** / Consult your health care provider.  Pneumococcal polysaccharide (PPSV23) vaccine.** / 1 to 2 doses if you smoke cigarettes or if you have certain conditions.  Meningococcal vaccine.** / 1 dose if you are age 68 to 8 years and a Market researcher living in a residence hall, or have one of several medical conditions, you need to get vaccinated against meningococcal disease. You may also need additional booster doses.  Hepatitis A vaccine.** / Consult your health care provider.  Hepatitis B vaccine.** / Consult your health care provider.  Haemophilus influenzae type b (Hib) vaccine.** / Consult your health care provider. Ages 7 to 53 years  Blood pressure check.** / Every year.  Lipid and cholesterol check.** / Every 5 years beginning at age 25 years.  Lung cancer screening. / Every year if you are aged 11-80 years and have a 30-pack-year history of smoking and currently smoke or have quit within the past 15 years. Yearly screening is stopped once you have quit smoking for at least 15 years or develop a health problem that would prevent you from having lung cancer treatment.  Clinical breast exam.** / Every year after age 48 years.  BRCA-related cancer risk assessment.** / For women who have family members with a BRCA-related cancer (breast, ovarian, tubal, or peritoneal cancers).  Mammogram.** / Every year beginning at age 41 years and continuing for as long as you are in good health. Consult with your health care provider.  Pap test.** / Every 3 years starting at age 65 years through age 37 or 70 years with a history of 3 consecutive normal Pap tests.  HPV screening.** / Every 3 years from ages 72 years through ages 60 to 40 years with a history of 3 consecutive normal Pap tests.  Fecal occult blood test (FOBT) of stool. / Every year beginning at age 21 years and continuing until age 5 years. You may not need to do this test if you get  a colonoscopy every 10 years.  Flexible sigmoidoscopy or colonoscopy.** / Every 5 years for a flexible sigmoidoscopy or every 10 years for a colonoscopy beginning at age 35 years and continuing until age 48 years.  Hepatitis C blood test.** / For all people born from [birth year] through 1965 and any individual with known risks for hepatitis C.  Skin self-exam. / Monthly.  Influenza vaccine. / Every year.  Tetanus, diphtheria, and acellular pertussis (Tdap/Td) vaccine.** / Consult your health care provider. Pregnant women should receive 1 dose of Tdap vaccine during each pregnancy. 1 dose of Td every 10 years.  Varicella vaccine.** / Consult your health care provider. Pregnant females who do not have evidence of immunity should receive the first dose after pregnancy.  Zoster vaccine.** / 1 dose for adults aged 30 years or older.  Measles, mumps, rubella (MMR) vaccine.** / You need at least 1 dose of MMR if you were born in 1957 or later. You may also need a second dose. For females of childbearing age, rubella immunity should be determined. If there is no evidence of immunity, females who are not pregnant should be vaccinated. If there is no evidence of immunity, females who are pregnant should delay immunization until after pregnancy.  Pneumococcal 13-valent conjugate (PCV13) vaccine.** / Consult your health care provider.  Pneumococcal polysaccharide (PPSV23) vaccine.** / 1 to 2 doses if you smoke cigarettes or if you have certain conditions.  Meningococcal vaccine.** /  Consult your health care provider.  Hepatitis A vaccine.** / Consult your health care provider.  Hepatitis B vaccine.** / Consult your health care provider.  Haemophilus influenzae type b (Hib) vaccine.** / Consult your health care provider. Ages 64 years and over  Blood pressure check.** / Every year.  Lipid and cholesterol check.** / Every 5 years beginning at age 23 years.  Lung cancer screening. / Every year if you  are aged 16-80 years and have a 30-pack-year history of smoking and currently smoke or have quit within the past 15 years. Yearly screening is stopped once you have quit smoking for at least 15 years or develop a health problem that would prevent you from having lung cancer treatment.  Clinical breast exam.** / Every year after age 74 years.  BRCA-related cancer risk assessment.** / For women who have family members with a BRCA-related cancer (breast, ovarian, tubal, or peritoneal cancers).  Mammogram.** / Every year beginning at age 44 years and continuing for as long as you are in good health. Consult with your health care provider.  Pap test.** / Every 3 years starting at age 58 years through age 22 or 39 years with 3 consecutive normal Pap tests. Testing can be stopped between 65 and 70 years with 3 consecutive normal Pap tests and no abnormal Pap or HPV tests in the past 10 years.  HPV screening.** / Every 3 years from ages 64 years through ages 70 or 61 years with a history of 3 consecutive normal Pap tests. Testing can be stopped between 65 and 70 years with 3 consecutive normal Pap tests and no abnormal Pap or HPV tests in the past 10 years.  Fecal occult blood test (FOBT) of stool. / Every year beginning at age 40 years and continuing until age 27 years. You may not need to do this test if you get a colonoscopy every 10 years.  Flexible sigmoidoscopy or colonoscopy.** / Every 5 years for a flexible sigmoidoscopy or every 10 years for a colonoscopy beginning at age 7 years and continuing until age 32 years.  Hepatitis C blood test.** / For all people born from [birth year] through 1967 and any individual with known risks for hepatitis C.  Osteoporosis screening.** / A one-time screening for women ages 30 years and over and women at risk for fractures or osteoporosis.  Skin self-exam. / Monthly.  Influenza vaccine. / Every year.  Tetanus, diphtheria, and acellular pertussis (Tdap/Td)  vaccine.** / 1 dose of Td every 10 years.  Varicella vaccine.** / Consult your health care provider.  Zoster vaccine.** / 1 dose for adults aged 35 years or older.  Pneumococcal 13-valent conjugate (PCV13) vaccine.** / Consult your health care provider.  Pneumococcal polysaccharide (PPSV23) vaccine.** / 1 dose for all adults aged 46 years and older.  Meningococcal vaccine.** / Consult your health care provider.  Hepatitis A vaccine.** / Consult your health care provider.  Hepatitis B vaccine.** / Consult your health care provider.  Haemophilus influenzae type b (Hib) vaccine.** / Consult your health care provider. ** Family history and personal history of risk and conditions may change your health care provider's recommendations.   This information is not intended to replace advice given to you by your health care provider. Make sure you discuss any questions you have with your health care provider.   Document Released: 10/11/2001 Document Revised: 09/05/2014 Document Reviewed: 01/10/2011 Elsevier Interactive Patient Education [employer].

## 2017-04-19 ENCOUNTER — Encounter: Payer: Self-pay | Admitting: Emergency Medicine

## 2017-04-19 ENCOUNTER — Ambulatory Visit (INDEPENDENT_AMBULATORY_CARE_PROVIDER_SITE_OTHER): Payer: 59

## 2017-04-19 ENCOUNTER — Ambulatory Visit (INDEPENDENT_AMBULATORY_CARE_PROVIDER_SITE_OTHER): Payer: 59 | Admitting: Emergency Medicine

## 2017-04-19 VITALS — BP 119/78 | HR 76 | Temp 98.9°F | Resp 16 | Ht 62.5 in | Wt 170.4 lb

## 2017-04-19 DIAGNOSIS — M25522 Pain in left elbow: Secondary | ICD-10-CM | POA: Diagnosis not present

## 2017-04-19 DIAGNOSIS — M7712 Lateral epicondylitis, left elbow: Secondary | ICD-10-CM

## 2017-04-19 MED ORDER — DICLOFENAC SODIUM 75 MG PO TBEC: 75.0000 mg | DELAYED_RELEASE_TABLET | Freq: Two times a day (BID) | ORAL | 0 refills | Status: AC

## 2017-04-19 MED ORDER — PREDNISONE 20 MG PO TABS: 40.0000 mg | ORAL_TABLET | Freq: Every day | ORAL | 0 refills | Status: AC

## 2017-04-19 NOTE — Progress Notes (Signed)
Samantha Francis 36 y.o.   Chief Complaint  Patient presents with  . Shoulder Pain    LEFT down arm and into the thumb x 1 month or more    HISTORY OF PRESENT ILLNESS: This is a 36 y.o. female complaining of left elbow pain x 2-3 weeks; yesterday had pain to left shoulder and left thumb, now gone. No other significant symptoms. 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  HPI   Prior to Admission medications   Medication Sig Start Date End Date Taking? Authorizing Provider  Prenatal Vit-Fe Fumarate-FA (PRENATAL MULTIVITAMIN) TABS tablet Take 1 tablet by mouth daily at 12 noon.    [provider]    Allergies  Allergen Reactions  . Doxycycline Nausea And Vomiting    Patient Active Problem List   Diagnosis Date Noted  . Status post repeat low transverse cesarean section 04/20/2016  . Anemia affecting pregnancy 03/03/2016  . Supervision of low-risk pregnancy 02/29/2016  . History of cesarean section 02/29/2016  . 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 Hx of preeclampsia, prior pregnancy, currently pregnant 02/29/2016    Past Medical History:  Diagnosis Date  . Allergic rhinitis   . Allergy   . Anemia    not with current pregnancy  . Asthma    childhood but never used an inhaler  . GERD (gastroesophageal reflux disease)    with pregnancy  . History of uterine fibroid   . HSV (herpes simplex virus) anogenital infection   . PONV (postoperative nausea and vomiting)   . Spinal headache     Past Surgical History:  Procedure Laterality Date  . CESAREAN SECTION  2009  . CESAREAN SECTION N/A 06/10/2014   Procedure: CESAREAN SECTION;  Surgeon: Allyn Kenner, DO;  Location: [facility];  Service: Obstetrics;  Laterality: N/A;  . CESAREAN SECTION N/A 04/20/2016   Procedure: CESAREAN SECTION;  Surgeon: Donnamae Jude, MD;  Location: [place];  Service: Obstetrics;  Laterality: N/A;  . MYOMECTOMY    . TUBAL LIGATION Bilateral  04/20/2016   Procedure: BILATERAL TUBAL LIGATION;  Surgeon: Donnamae Jude, MD;  Location: [place];  Service: Obstetrics;  Laterality: Bilateral;    Social History   Social History  . Marital status: Married    Spouse name: N/A  . Number of children: N/A  . Years of education: N/A   Occupational History  . community support specialist    Social History Main Topics  . Smoking status: Never Smoker  . Smokeless tobacco: Never Used  . Alcohol use No  . Drug use: No  . Sexual activity: Not Currently     Comment: plans to discuss with provider 11/23 at appt   Other Topics Concern  . Not on file   Social History Narrative  . No narrative on file    Family History  Problem Relation Age of Onset  . Hyperlipidemia Mother   . Hypertension Father   . Hyperlipidemia Sister   . Hyperlipidemia Maternal Grandfather   . Hypertension Maternal Grandfather      Review of Systems  Constitutional: Negative.  Negative for chills and fever.  HENT: Negative.   Eyes: Negative.   Respiratory: Negative.  Negative for cough and hemoptysis.   Cardiovascular: Negative.  Negative for chest pain and palpitations.  Gastrointestinal: Negative for abdominal pain, diarrhea, nausea and vomiting.  Genitourinary: Negative.  Negative for dysuria and hematuria.  Musculoskeletal: Positive for joint pain. Negative for myalgias and neck  pain.  Skin: Negative.  Negative for rash.  Neurological: Negative.  Negative for sensory change and focal weakness.  Endo/Heme/Allergies: Negative.    Vitals:   04/19/17 0854  BP: 119/78  Pulse: 76  Resp: 16  Temp: 98.9 F (37.2 C)  SpO2: 100%     Physical Exam  Constitutional: She is oriented to person, place, and time. She appears well-developed and well-nourished.  HENT:  Head: Normocephalic.  Eyes: Pupils are equal, round, and reactive to light. EOM are normal.  Neck: Normal range of motion. Neck supple.  Cardiovascular: Normal rate, regular  rhythm and normal heart sounds.   Pulmonary/Chest: Effort normal and breath sounds normal.  Musculoskeletal:  Left elbow: +tender lateral epicondyle; FROM Left shoulder and wrist: WNL.  Neurological: She is alert and oriented to person, place, and time. No sensory deficit. She exhibits normal muscle tone.  Skin: Skin is warm and dry. Capillary refill takes less than 2 seconds. No rash noted.  Psychiatric: She has a normal mood and affect. Her behavior is normal.  Vitals reviewed.  Dg Elbow Complete Left (3+view)  Result Date: 04/19/2017 CLINICAL DATA:  Lateral epicondylitis of the left elbow. EXAM: LEFT ELBOW - COMPLETE 3+ VIEW COMPARISON:  None. FINDINGS: There is no evidence of fracture, dislocation, or joint effusion. There is no evidence of arthropathy or other focal bone abnormality. Soft tissues are unremarkable. IMPRESSION: Negative. Electronically Signed   By: Lorriane Shire M.D.   On: 04/19/2017 09:22     ASSESSMENT & PLAN: Donnamaria was seen today for shoulder pain.  Diagnoses and all orders for this visit:  Lateral epicondylitis of left elbow -     DG ELBOW COMPLETE LEFT (3+VIEW); Future  Left elbow pain  Other orders -     diclofenac (VOLTAREN) 75 MG EC tablet; Take 1 tablet (75 mg total) by mouth 2 (two) times daily. -     predniSONE (DELTASONE) 20 MG tablet; Take 2 tablets (40 mg total) by mouth daily with breakfast.     Patient Instructions       IF you received an x-ray today, you will receive an invoice from [facility]. Please contact [facility] at [phone] with questions or concerns regarding your invoice.   IF you received labwork today, you will receive an invoice from [facility]. Please contact [facility] at [phone] with questions or concerns regarding your invoice.   Our billing staff will not be able to assist you with questions regarding bills from these companies.  You will be contacted with the lab results as soon as they  are available. The fastest way to get your results is to activate your My Chart account. Instructions are located on the last page of this paperwork. If you have not heard from [place] regarding the results in 2 weeks, please contact this office.     Tennis Elbow Tennis elbow is puffiness (inflammation) of the outer tendons of your forearm close to your elbow. Your tendons attach your muscles to your bones. Tennis elbow can happen in any sport or job in which you use your elbow too much. It is caused by doing the same motion over and over. Tennis elbow can cause:  Pain and tenderness in your forearm and the outer part of your elbow.  A burning feeling. This runs from your elbow through your arm.  Weak grip in your hands.  Follow these instructions at home: Activity  Rest your elbow and wrist as told by your doctor. Try to avoid any  activities that caused the problem until your doctor says that you can do them again.  If a physical therapist teaches you exercises, do all of them as told.  If you lift an object, lift it with your palm facing up. This is easier on your elbow. Lifestyle  If your tennis elbow is caused by sports, check your equipment and make sure that: ? You are using it correctly. ? It fits you well.  If your tennis elbow is caused by work, take breaks often, if you are able. Talk with your manager about doing your work in a way that is safe for you. ? If your tennis elbow is caused by computer use, talk with your manager about any changes that can be made to your work setup. General instructions  If told, apply ice to the painful area: ? Put ice in a plastic bag. ? Place a towel between your skin and the bag. ? Leave the ice on for 20 minutes, 2-3 times per day.  Take medicines only as told by your doctor.  If you were given a brace, wear it as told by your doctor.  Keep all follow-up visits as told by your doctor. This is important. Contact a doctor if:  Your  pain does not get better with treatment.  Your pain gets worse.  You have weakness in your forearm, hand, or fingers.  You cannot feel your forearm, hand, or fingers. This information is not intended to replace advice given to you by your health care provider. Make sure you discuss any questions you have with your health care provider. Document Released: 02/02/2010 Document Revised: 04/14/2016 Document Reviewed: 08/11/2014 Elsevier Interactive Patient Education  2018 Elsevier Inc.      Agustina Caroli, MD [facility]

## 2017-04-19 NOTE — Patient Instructions (Addendum)
     IF you received an x-ray today, you will receive an invoice from [facility]. Please contact [facility] at [phone] with questions or concerns regarding your invoice.   IF you received labwork today, you will receive an invoice from [facility]. Please contact [facility] at [phone] with questions or concerns regarding your invoice.   Our billing staff will not be able to assist you with questions regarding bills from these companies.  You will be contacted with the lab results as soon as they are available. The fastest way to get your results is to activate your My Chart account. Instructions are located on the last page of this paperwork. If you have not heard from [place] regarding the results in 2 weeks, please contact this office.     Tennis Elbow Tennis elbow is puffiness (inflammation) of the outer tendons of your forearm close to your elbow. Your tendons attach your muscles to your bones. Tennis elbow can happen in any sport or job in which you use your elbow too much. It is caused by doing the same motion over and over. Tennis elbow can cause:  Pain and tenderness in your forearm and the outer part of your elbow.  A burning feeling. This runs from your elbow through your arm.  Weak grip in your hands.  Follow these instructions at home: Activity  Rest your elbow and wrist as told by your doctor. Try to avoid any activities that caused the problem until your doctor says that you can do them again.  If a physical therapist teaches you exercises, do all of them as told.  If you lift an object, lift it with your palm facing up. This is easier on your elbow. Lifestyle  If your tennis elbow is caused by sports, check your equipment and make sure that: ? You are using it correctly. ? It fits you well.  If your tennis elbow is caused by work, take breaks often, if you are able. Talk with your manager about doing your work in a way that is safe for you. ? If  your tennis elbow is caused by computer use, talk with your manager about any changes that can be made to your work setup. General instructions  If told, apply ice to the painful area: ? Put ice in a plastic bag. ? Place a towel between your skin and the bag. ? Leave the ice on for 20 minutes, 2-3 times per day.  Take medicines only as told by your doctor.  If you were given a brace, wear it as told by your doctor.  Keep all follow-up visits as told by your doctor. This is important. Contact a doctor if:  Your pain does not get better with treatment.  Your pain gets worse.  You have weakness in your forearm, hand, or fingers.  You cannot feel your forearm, hand, or fingers. This information is not intended to replace advice given to you by your health care provider. Make sure you discuss any questions you have with your health care provider. Document Released: 02/02/2010 Document Revised: 04/14/2016 Document Reviewed: 08/11/2014 Elsevier Interactive Patient Education  [employer].

## 2017-04-30 ENCOUNTER — Other Ambulatory Visit: Payer: Self-pay | Admitting: Emergency Medicine

## 2017-05-03 ENCOUNTER — Other Ambulatory Visit: Payer: Self-pay | Admitting: Emergency Medicine

## 2018-05-17 ENCOUNTER — Encounter: Payer: Self-pay | Admitting: Family Medicine

## 2018-05-17 ENCOUNTER — Ambulatory Visit (INDEPENDENT_AMBULATORY_CARE_PROVIDER_SITE_OTHER): Payer: 59 | Admitting: Family Medicine

## 2018-05-17 VITALS — BP 127/81 | HR 71 | Ht 62.0 in | Wt 181.0 lb

## 2018-05-17 DIAGNOSIS — Z124 Encounter for screening for malignant neoplasm of cervix: Secondary | ICD-10-CM | POA: Diagnosis not present

## 2018-05-17 DIAGNOSIS — Z1151 Encounter for screening for human papillomavirus (HPV): Secondary | ICD-10-CM

## 2018-05-17 DIAGNOSIS — Z23 Encounter for immunization: Secondary | ICD-10-CM | POA: Diagnosis not present

## 2018-05-17 DIAGNOSIS — Z803 Family history of malignant neoplasm of breast: Secondary | ICD-10-CM | POA: Diagnosis not present

## 2018-05-17 DIAGNOSIS — Z01419 Encounter for gynecological examination (general) (routine) without abnormal findings: Secondary | ICD-10-CM | POA: Diagnosis not present

## 2018-05-17 DIAGNOSIS — N8003 Adenomyosis of the uterus: Secondary | ICD-10-CM | POA: Insufficient documentation

## 2018-05-17 DIAGNOSIS — N920 Excessive and frequent menstruation with regular cycle: Secondary | ICD-10-CM

## 2018-05-17 DIAGNOSIS — Z113 Encounter for screening for infections with a predominantly sexual mode of transmission: Secondary | ICD-10-CM | POA: Diagnosis not present

## 2018-05-17 DIAGNOSIS — D500 Iron deficiency anemia secondary to blood loss (chronic): Secondary | ICD-10-CM

## 2018-05-17 DIAGNOSIS — Z8041 Family history of malignant neoplasm of ovary: Secondary | ICD-10-CM | POA: Insufficient documentation

## 2018-05-17 DIAGNOSIS — Z01411 Encounter for gynecological examination (general) (routine) with abnormal findings: Secondary | ICD-10-CM

## 2018-05-17 MED ORDER — MEGESTROL ACETATE 40 MG PO TABS: 40.0000 mg | ORAL_TABLET | Freq: Two times a day (BID) | ORAL | 3 refills | Status: DC

## 2018-05-17 MED ORDER — AZITHROMYCIN 250 MG PO TABS: 250.0000 mg | ORAL_TABLET | Freq: Every day | ORAL | 0 refills | Status: DC

## 2018-05-17 NOTE — Progress Notes (Signed)
  Subjective:     Samantha Francis is a 37 y.o. female and is here for a comprehensive physical exam. The patient reports problems - heavy cycles since BTL, worse in last 3 months.. S/p BTL with last delivery. Now having worsening cycles that last 5-6 days. Bleeding every 15-17 days.  Notes she is changing a tampon q 1-2 hours. Has h/o fibroids with prior myomectomy and previous C-section x 3.  Sees [facility] [place]. [place] crisis at home.  The following portions of the patient's history were reviewed and updated as appropriate: allergies, current medications, past family history, past medical history, past social history, past surgical history and problem list. Family h/o ovarian cancer in maternal aunt, who died in late 17's -early 74's.  Review of Systems Pertinent items noted in HPI and remainder of comprehensive ROS otherwise negative.   Objective:    BP 127/81   Pulse 71   Ht '5\' 2"'$  (1.575 m)   Wt 181 lb (82.1 kg)   LMP 05/13/2018   Breastfeeding? No   BMI 33.11 kg/m  General appearance: alert, cooperative and appears stated age Head: Normocephalic, without obvious abnormality, atraumatic Neck: no adenopathy, supple, symmetrical, trachea midline and thyroid not enlarged, symmetric, no tenderness/mass/nodules Lungs: clear to auscultation bilaterally Breasts: normal appearance, no masses or tenderness Heart: regular rate and rhythm, S1, S2 normal, no murmur, click, rub or gallop Abdomen: soft, non-tender; bowel sounds normal; no masses,  no organomegaly Pelvic: cervix normal in appearance, external genitalia normal, no adnexal masses or tenderness, no cervical motion tenderness, vagina normal without discharge and uterus mildly enlarged 8-10 wk size Extremities: extremities normal, atraumatic, no cyanosis or edema Pulses: 2+ and symmetric Skin: Skin color, texture, turgor normal. No rashes or lesions Lymph nodes: Cervical, supraclavicular, and axillary  nodes normal. Neurologic: Grossly normal    Assessment:    Healthy female exam.      Plan:   Problem List Items Addressed This Visit      Unprioritized   Family history of ovarian cancer    Check [facility]--      Relevant Orders   Genetic Screening   Menorrhagia with regular cycle - Primary    Treat for presumed endometritis, allergy to doxy-will substitute Azithro. Check CBC, TSH and pelvic sono. Trial of Megace with cycles. Briefly discussed possible treatment options including po progesterone, IUD, endometrial ablation, D & C with hysteroscopic removal of polyp/fibroid, or UFE if needed. Also discussed hysterectomy. She is not interested in hyst at this time. Risks reviewed a bit.      Relevant Medications   megestrol (MEGACE) 40 MG tablet   azithromycin (ZITHROMAX) 250 MG tablet   Other Relevant Orders   CBC   TSH   US PELVIC COMPLETE WITH TRANSVAGINAL    Other Visit Diagnoses    Flu vaccine need       Relevant Orders   Flu Vaccine QUAD 36+ mos IM (Completed)   Well woman exam with routine gynecological exam       Relevant Orders   Cytology - PAP   Comprehensive metabolic panel   Lipid panel   Screening for malignant neoplasm of cervix       Encounter for gynecological examination with abnormal finding         Return in 4 weeks (on 06/14/2018) for a follow-up.    See After Visit Summary for Counseling Recommendations

## 2018-05-17 NOTE — Assessment & Plan Note (Signed)
Treat for presumed endometritis, allergy to doxy-will substitute Azithro. Check CBC, TSH and pelvic sono. Trial of Megace with cycles. Briefly discussed possible treatment options including po progesterone, IUD, endometrial ablation, D & C with hysteroscopic removal of polyp/fibroid, or UFE if needed. Also discussed hysterectomy. She is not interested in hyst at this time. Risks reviewed a bit.

## 2018-05-17 NOTE — Patient Instructions (Signed)
Preventive Care 18-39 Years, Female Preventive care refers to lifestyle choices and visits with your health care provider that can promote health and wellness. What does preventive care include?  A yearly physical exam. This is also called an annual well check.  Dental exams once or twice a year.  Routine eye exams. Ask your health care provider how often you should have your eyes checked.  Personal lifestyle choices, including: ? Daily care of your teeth and gums. ? Regular physical activity. ? Eating a healthy diet. ? Avoiding tobacco and drug use. ? Limiting alcohol use. ? Practicing safe sex. ? Taking vitamin and mineral supplements as recommended by your health care provider. What happens during an annual well check? The services and screenings done by your health care provider during your annual well check will depend on your age, overall health, lifestyle risk factors, and family history of disease. Counseling Your health care provider may ask you questions about your:  Alcohol use.  Tobacco use.  Drug use.  Emotional well-being.  Home and relationship well-being.  Sexual activity.  Eating habits.  Work and work Statistician.  Method of birth control.  Menstrual cycle.  Pregnancy history.  Screening You may have the following tests or measurements:  Height, weight, and BMI.  Diabetes screening. This is done by checking your blood sugar (glucose) after you have not eaten for a while (fasting).  Blood pressure.  Lipid and cholesterol levels. These may be checked every 5 years starting at age 38.  Skin check.  Hepatitis C blood test.  Hepatitis B blood test.  Sexually transmitted disease (STD) testing.  BRCA-related cancer screening. This may be done if you have a family history of breast, ovarian, tubal, or peritoneal cancers.  Pelvic exam and Pap test. This may be done every 3 years starting at age 38. Starting at age 30, this may be done  every 5 years if you have a Pap test in combination with an HPV test.  Discuss your test results, treatment options, and if necessary, the need for more tests with your health care provider. Vaccines Your health care provider may recommend certain vaccines, such as:  Influenza vaccine. This is recommended every year.  Tetanus, diphtheria, and acellular pertussis (Tdap, Td) vaccine. You may need a Td booster every 10 years.  Varicella vaccine. You may need this if you have not been vaccinated.  HPV vaccine. If you are 39 or younger, you may need three doses over 6 months.  Measles, mumps, and rubella (MMR) vaccine. You may need at least one dose of MMR. You may also need a second dose.  Pneumococcal 13-valent conjugate (PCV13) vaccine. You may need this if you have certain conditions and were not previously vaccinated.  Pneumococcal polysaccharide (PPSV23) vaccine. You may need one or two doses if you smoke cigarettes or if you have certain conditions.  Meningococcal vaccine. One dose is recommended if you are age 68-21 years and a first-year college student living in a residence hall, or if you have one of several medical conditions. You may also need additional booster doses.  Hepatitis A vaccine. You may need this if you have certain conditions or if you travel or work in places where you may be exposed to hepatitis A.  Hepatitis B vaccine. You may need this if you have certain conditions or if you travel or work in places where you may be exposed to hepatitis B.  Haemophilus influenzae type b (Hib) vaccine. You may need this  if you have certain risk factors.  Talk to your health care provider about which screenings and vaccines you need and how often you need them. This information is not intended to replace advice given to you by your health care provider. Make sure you discuss any questions you have with your health care provider. Document Released: 10/11/2001 Document Revised:  05/04/2016 Document Reviewed: 06/16/2015 Elsevier Interactive Patient Education  2018 Elsevier Inc.  

## 2018-05-17 NOTE — Assessment & Plan Note (Signed)
Check [facility]--

## 2018-05-18 ENCOUNTER — Encounter: Payer: Self-pay | Admitting: *Deleted

## 2018-05-18 LAB — COMPREHENSIVE METABOLIC PANEL
ALBUMIN: 4.3 g/dL (ref 3.5–5.5)
ALT: 8 IU/L (ref 0–32)
AST: 14 IU/L (ref 0–40)
Albumin/Globulin Ratio: 1.5 (ref 1.2–2.2)
Alkaline Phosphatase: 56 IU/L (ref 39–117)
BUN / CREAT RATIO: 11 (ref 9–23)
BUN: 8 mg/dL (ref 6–20)
Bilirubin Total: 0.2 mg/dL (ref 0.0–1.2)
CO2: 21 mmol/L (ref 20–29)
CREATININE: 0.75 mg/dL (ref 0.57–1.00)
Calcium: 9.1 mg/dL (ref 8.7–10.2)
Chloride: 105 mmol/L (ref 96–106)
GFR calc non Af Amer: 101 mL/min/{1.73_m2} (ref >59)
GFR, EST AFRICAN AMERICAN: 116 mL/min/{1.73_m2} (ref >59)
GLOBULIN, TOTAL: 2.8 g/dL (ref 1.5–4.5)
GLUCOSE: 85 mg/dL (ref 65–99)
Potassium: 4.1 mmol/L (ref 3.5–5.2)
Sodium: 139 mmol/L (ref 134–144)
TOTAL PROTEIN: 7.1 g/dL (ref 6.0–8.5)

## 2018-05-18 LAB — LIPID PANEL
CHOL/HDL RATIO: 4.8 ratio — AB (ref 0.0–4.4)
Cholesterol, Total: 192 mg/dL (ref 100–199)
HDL: 40 mg/dL (ref >39)
LDL CALC: 136 mg/dL — AB (ref 0–99)
Triglycerides: 78 mg/dL (ref 0–149)
VLDL CHOLESTEROL CAL: 16 mg/dL (ref 5–40)

## 2018-05-18 LAB — CBC
HEMATOCRIT: 27.4 % — AB (ref 34.0–46.6)
HEMOGLOBIN: 7.9 g/dL — AB (ref 11.1–15.9)
MCH: 21.1 pg — ABNORMAL LOW (ref 26.6–33.0)
MCHC: 28.8 g/dL — AB (ref 31.5–35.7)
MCV: 73 fL — AB (ref 79–97)
Platelets: 265 10*3/uL (ref 150–450)
RBC: 3.74 x10E6/uL — ABNORMAL LOW (ref 3.77–5.28)
RDW: 16.3 % — ABNORMAL HIGH (ref 12.3–15.4)
WBC: 3.6 10*3/uL (ref 3.4–10.8)

## 2018-05-18 LAB — TSH: TSH: 2.48 u[IU]/mL (ref 0.450–4.500)

## 2018-05-18 MED ORDER — FERROUS SULFATE 325 (65 FE) MG PO TABS: 325.0000 mg | ORAL_TABLET | Freq: Two times a day (BID) | ORAL | 1 refills | Status: DC

## 2018-05-18 NOTE — Addendum Note (Signed)
Addended by: Donnamae Jude on: 05/18/2018 08:11 AM   Modules accepted: Orders

## 2018-05-21 ENCOUNTER — Encounter: Payer: Self-pay | Admitting: *Deleted

## 2018-05-21 LAB — CYTOLOGY - PAP
Adequacy: ABSENT
Chlamydia: NEGATIVE
Diagnosis: NEGATIVE
HPV (WINDOPATH): NOT DETECTED
NEISSERIA GONORRHEA: NEGATIVE
TRICH (WINDOWPATH): NEGATIVE

## 2018-05-24 ENCOUNTER — Encounter: Payer: Self-pay | Admitting: *Deleted

## 2018-05-30 ENCOUNTER — Encounter: Payer: Self-pay | Admitting: *Deleted

## 2018-06-04 ENCOUNTER — Telehealth: Payer: Self-pay | Admitting: *Deleted

## 2018-06-04 NOTE — Telephone Encounter (Addendum)
Message received on 10/4 from [facility] representative.  She stated that she had faxed instructions to the office on 9/30 regarding how to obtain prior auth for pt's test. She is following up on the request. Please call back. [phone]  Ext [phone] 10/21 Per chart review, Prior Josem Kaufmann was completed on 10/9. Documentation can be viewed in another encounter.

## 2018-06-06 ENCOUNTER — Telehealth: Payer: Self-pay | Admitting: *Deleted

## 2018-06-06 NOTE — Telephone Encounter (Signed)
Received fax from [facility] requesting prior auth be completed. Called [facility] and completed as requested. Prior auth [id number] 

## 2018-06-20 ENCOUNTER — Telehealth: Payer: Self-pay | Admitting: Family Medicine

## 2018-06-20 NOTE — Telephone Encounter (Signed)
Patient state she has been on her cycle since Oct 4, she has been taking the medicine given and its not helping.

## 2018-06-22 NOTE — Telephone Encounter (Signed)
Pt reports she has been on her cycle for 2 weeks despite taking megace twice a day as prescribed.  Intermittently heavy-changing an ultra tampon every 2 hours but over all has lightened a little bit. Advised pt that will have front office staff call pt to schedule an appointment with Dr. Kennon Rounds.

## 2018-07-13 ENCOUNTER — Ambulatory Visit (INDEPENDENT_AMBULATORY_CARE_PROVIDER_SITE_OTHER): Payer: 59 | Admitting: Family Medicine

## 2018-07-13 ENCOUNTER — Encounter: Payer: Self-pay | Admitting: Family Medicine

## 2018-07-13 DIAGNOSIS — N920 Excessive and frequent menstruation with regular cycle: Secondary | ICD-10-CM

## 2018-07-13 DIAGNOSIS — D500 Iron deficiency anemia secondary to blood loss (chronic): Secondary | ICD-10-CM | POA: Diagnosis not present

## 2018-07-13 NOTE — Assessment & Plan Note (Signed)
Last u/s in 2015 showed small fibroids. Need this result. Other labs including TSH ok. But very anemic. Hgb 7.9. Needs endometrial sampling. Risks of surgery discussed. Not a good candidate for endometrial ablation due to myomectomy. Consider IR for UFE if obvious. If there is a polyp, submucosal fibroid could consider hysteroscopic removal. Risks of bowel injury with hysterectomy discussed. After careful consideration, she would like IUD placement, would sample endometrium at same time. To return for this following her u/s. If this fails, then may need hyst. 10% chance of bowel injury quoted. Would do abd. hyst or robotic if needed. May double her Megace to 80 mg bid if needed to control bleeding between now and IUD placement.

## 2018-07-13 NOTE — Assessment & Plan Note (Signed)
Due to heavy cycles. Continue iron.

## 2018-07-13 NOTE — Progress Notes (Signed)
   Subjective:    Patient ID: Samantha Francis is a 37 y.o. female presenting with Menorrhagia  on 07/13/2018  HPI: Patient with h/o myomectomy x 1 and C/section x 3. Has h/o fibroids. With abnormal bleeding since BTL.  Cycles come q 15-17 days and lasts 5-6 days. Had bleeding from 10-11 and continued for 17 days and was very heavy. Had to go home and leave work. Using pad and tampon together and bled through that.. Taking Megace bid and that didn't work to stop her bleeding. Stopped it on Monday when stopped bleeding.  Review of Systems  Constitutional: Negative for chills and fever.  Respiratory: Negative for shortness of breath.   Cardiovascular: Negative for chest pain.  Gastrointestinal: Negative for abdominal pain, nausea and vomiting.  Genitourinary: Negative for dysuria.  Skin: Negative for rash.      Objective:    BP 139/86   Pulse (!) 58   Wt 184 lb 1.6 oz (83.5 kg)   LMP 06/08/2018 (Exact Date)   BMI 33.67 kg/m  Physical Exam  Constitutional: She is oriented to person, place, and time. She appears well-developed and well-nourished. No distress.  HENT:  Head: Normocephalic and atraumatic.  Eyes: No scleral icterus.  Neck: Neck supple.  Cardiovascular: Normal rate.  Pulmonary/Chest: Effort normal.  Abdominal: Soft.  Neurological: She is alert and oriented to person, place, and time.  Skin: Skin is warm and dry.  Psychiatric: She has a normal mood and affect.        Assessment & Plan:   Problem List Items Addressed This Visit      Unprioritized   Menorrhagia with regular cycle    Last u/s in 2015 showed small fibroids. Need this result. Other labs including TSH ok. But very anemic. Hgb 7.9. Needs endometrial sampling. Risks of surgery discussed. Not a good candidate for endometrial ablation due to myomectomy. Consider IR for UFE if obvious. If there is a polyp, submucosal fibroid could consider hysteroscopic removal. Risks of bowel injury with  hysterectomy discussed. After careful consideration, she would like IUD placement, would sample endometrium at same time. To return for this following her u/s. If this fails, then may need hyst. 10% chance of bowel injury quoted. Would do abd. hyst or robotic if needed. May double her Megace to 80 mg bid if needed to control bleeding between now and IUD placement.      Iron deficiency anemia due to chronic blood loss    Due to heavy cycles. Continue iron.          Total face-to-face time with patient: 25 minutes. Over 50% of encounter was spent on counseling and coordination of care. Return in about 4 weeks (around 08/10/2018) for IUD placement and endometrial biopsy.  Donnamae Jude 07/13/2018 9:55 AM

## 2018-07-13 NOTE — Patient Instructions (Signed)

## 2018-07-20 ENCOUNTER — Ambulatory Visit (HOSPITAL_COMMUNITY)
Admission: RE | Admit: 2018-07-20 | Discharge: 2018-07-20 | Disposition: A | Discharge status: Home / Self Care | Payer: 59 | Source: Ambulatory Visit | Attending: Family Medicine | Admitting: Family Medicine

## 2018-07-20 DIAGNOSIS — N920 Excessive and frequent menstruation with regular cycle: Secondary | ICD-10-CM | POA: Insufficient documentation

## 2018-08-15 ENCOUNTER — Other Ambulatory Visit (HOSPITAL_COMMUNITY)
Admission: RE | Admit: 2018-08-15 | Discharge: 2018-08-15 | Disposition: A | Discharge status: Home / Self Care | Payer: 59 | Source: Ambulatory Visit | Attending: Family Medicine | Admitting: Family Medicine

## 2018-08-15 ENCOUNTER — Ambulatory Visit (INDEPENDENT_AMBULATORY_CARE_PROVIDER_SITE_OTHER): Payer: 59 | Admitting: Family Medicine

## 2018-08-15 ENCOUNTER — Encounter: Payer: Self-pay | Admitting: Family Medicine

## 2018-08-15 VITALS — BP 137/75 | HR 72 | Ht 62.5 in | Wt 184.9 lb

## 2018-08-15 DIAGNOSIS — Z30430 Encounter for insertion of intrauterine contraceptive device: Secondary | ICD-10-CM

## 2018-08-15 DIAGNOSIS — N920 Excessive and frequent menstruation with regular cycle: Secondary | ICD-10-CM

## 2018-08-15 DIAGNOSIS — Z975 Presence of (intrauterine) contraceptive device: Secondary | ICD-10-CM

## 2018-08-15 DIAGNOSIS — D500 Iron deficiency anemia secondary to blood loss (chronic): Secondary | ICD-10-CM | POA: Diagnosis not present

## 2018-08-15 DIAGNOSIS — N800 Endometriosis of uterus: Secondary | ICD-10-CM | POA: Diagnosis not present

## 2018-08-15 LAB — POCT PREGNANCY, URINE: Preg Test, Ur: NEGATIVE

## 2018-08-15 MED ORDER — LEVONORGESTREL 19.5 MCG/DAY IU IUD
INTRAUTERINE_SYSTEM | Freq: Once | INTRAUTERINE | Status: AC
  Administered 2018-08-15: 09:00:00 via INTRAUTERINE

## 2018-08-15 MED ORDER — IBUPROFEN 200 MG PO TABS
800.0000 mg | ORAL_TABLET | Freq: Once | ORAL | Status: AC
  Administered 2018-08-15: 800 mg via ORAL

## 2018-08-15 NOTE — Patient Instructions (Signed)
Intrauterine Device Insertion, Care After    This sheet gives you information about how to care for yourself after your procedure. Your health care provider may also give you more specific instructions. If you have problems or questions, contact your health care provider.  What can I expect after the procedure?  After the procedure, it is common to have:  · Cramps and pain in the abdomen.  · Light bleeding (spotting) or heavier bleeding that is like your menstrual period. This may last for up to a few days.  · Lower back pain.  · Dizziness.  · Headaches.  · Nausea.  Follow these instructions at home:  · Before resuming sexual activity, check to make sure that you can feel the IUD string(s). You should be able to feel the end of the string(s) below the opening of your cervix. If your IUD string is in place, you may resume sexual activity.  ? If you had a hormonal IUD inserted more than 7 days after your most recent period started, you will need to use a backup method of birth control for 7 days after IUD insertion. Ask your health care provider whether this applies to you.  · Continue to check that the IUD is still in place by feeling for the string(s) after every menstrual period, or once a month.  · Take over-the-counter and prescription medicines only as told by your health care provider.  · Do not drive or use heavy machinery while taking prescription pain medicine.  · Keep all follow-up visits as told by your health care provider. This is important.  Contact a health care provider if:  · You have bleeding that is heavier or lasts longer than a normal menstrual cycle.  · You have a fever.  · You have cramps or abdominal pain that get worse or do not get better with medicine.  · You develop abdominal pain that is new or is not in the same area of earlier cramping and pain.  · You feel lightheaded or weak.  · You have abnormal or bad-smelling discharge from your vagina.  · You have pain during sexual  activity.  · You have any of the following problems with your IUD string(s):  ? The string bothers or hurts you or your sexual partner.  ? You cannot feel the string.  ? The string has gotten longer.  · You can feel the IUD in your vagina.  · You think you may be pregnant, or you miss your menstrual period.  · You think you may have an STI (sexually transmitted infection).  Get help right away if:  · You have flu-like symptoms.  · You have a fever and chills.  · You can feel that your IUD has slipped out of place.  Summary  · After the procedure, it is common to have cramps and pain in the abdomen. It is also common to have light bleeding (spotting) or heavier bleeding that is like your menstrual period.  · Continue to check that the IUD is still in place by feeling for the string(s) after every menstrual period, or once a month.  · Keep all follow-up visits as told by your health care provider. This is important.  · Contact your health care provider if you have problems with your IUD string(s), such as the string getting longer or bothering you or your sexual partner.  This information is not intended to replace advice given to you by your health care provider. Make   sure you discuss any questions you have with your health care provider.  Document Released: 04/13/2011 Document Revised: 07/06/2016 Document Reviewed: 07/06/2016  Elsevier Interactive Patient Education © 2019 [employer]

## 2018-08-15 NOTE — Assessment & Plan Note (Addendum)
Reviewed u/s results. Discussion of adenomyosis, pathophysiology, etiology contribution to bleeding, timing of issues following last c-section. S/p EMB today. Continue IUD. Megace for 4-5 days if bleeding heavily. Spotting for 1-3 months discussed as possibility with IUD. Hopefully this will be the treatment she needs.

## 2018-08-15 NOTE — Assessment & Plan Note (Signed)
Should improve with control of menorrhagia

## 2018-08-15 NOTE — Progress Notes (Signed)
   Subjective:    Patient ID: Samantha Francis is a 37 y.o. female presenting with Procedure  on 08/15/2018  HPI: Here today for f/u. Has h/o heavy cycles, c-s x 3, s/p myomectomy x 1. U/s reveals diffuse adenomyosis. She reports relief with Megace. For treatment today.  Review of Systems  Constitutional: Negative for chills and fever.  Respiratory: Negative for shortness of breath.   Cardiovascular: Negative for chest pain.  Gastrointestinal: Negative for abdominal pain, nausea and vomiting.  Genitourinary: Negative for dysuria.  Skin: Negative for rash.      Objective:    BP 137/75   Pulse 72   Ht 5' 2.5" (1.588 m)   Wt 184 lb 14.4 oz (83.9 kg)   LMP 07/21/2018 (Exact Date)   BMI 33.28 kg/m  Physical Exam Constitutional:      General: She is not in acute distress.    Appearance: She is well-developed.  HENT:     Head: Normocephalic and atraumatic.  Eyes:     General: No scleral icterus. Neck:     Musculoskeletal: Neck supple.  Cardiovascular:     Rate and Rhythm: Normal rate.  Pulmonary:     Effort: Pulmonary effort is normal.  Abdominal:     Palpations: Abdomen is soft.  Skin:    General: Skin is warm and dry.  Neurological:     Mental Status: She is alert and oriented to person, place, and time.   Procedure: Patient given informed consent, signed copy in the chart, time out was performed. Appropriate time out taken. The patient was placed in the lithotomy position and the cervix brought into view with sterile speculum.  Portio of cervix cleansed x 2 with betadine swabs.  A tenaculum was placed in the anterior lip of the cervix.  The uterus was sounded for depth of 10 cm. A pipelle was introduced to into the uterus, suction created,  and an endometrial sample was obtained. All equipment was removed and accounted for.  The patient tolerated the procedure well.   Procedure: Uterus sounded to 10 cm.  Liletta IUD placed per manufacturer's recommendations.   Strings trimmed to 3 cm.      Assessment & Plan:   Problem List Items Addressed This Visit      Unprioritized   Menorrhagia with regular cycle    Reviewed u/s results. Discussion of adenomyosis, pathophysiology, etiology contribution to bleeding, timing of issues following last c-section. S/p EMB today. Continue IUD. Megace for 4-5 days if bleeding heavily. Spotting for 1-3 months discussed as possibility with IUD. Hopefully this will be the treatment she needs.      Iron deficiency anemia due to chronic blood loss    Should improve with control of menorrhagia       Other Visit Diagnoses    Contraception, device intrauterine    -  Primary   Relevant Medications   Levonorgestrel (LILETTA) 19.5 MCG/DAY IUD (Completed)      Total face-to-face time with patient: 15 minutes. Over 50% of encounter was spent on counseling and coordination of care. Return in about 3 months (around 11/14/2018).  Donnamae Jude 08/15/2018 9:01 AM

## 2018-10-06 DIAGNOSIS — J309 Allergic rhinitis, unspecified: Secondary | ICD-10-CM | POA: Diagnosis not present

## 2018-10-06 DIAGNOSIS — R05 Cough: Secondary | ICD-10-CM | POA: Diagnosis not present

## 2018-11-05 ENCOUNTER — Ambulatory Visit: Payer: 59 | Admitting: Family Medicine

## 2019-01-24 ENCOUNTER — Other Ambulatory Visit: Payer: Self-pay | Admitting: Family Medicine

## 2019-01-24 DIAGNOSIS — N920 Excessive and frequent menstruation with regular cycle: Secondary | ICD-10-CM

## 2019-05-09 ENCOUNTER — Other Ambulatory Visit: Payer: Self-pay

## 2019-05-09 DIAGNOSIS — Z20822 Contact with and (suspected) exposure to covid-19: Secondary | ICD-10-CM

## 2019-05-10 LAB — NOVEL CORONAVIRUS, NAA

## 2019-05-13 ENCOUNTER — Telehealth: Payer: Self-pay | Admitting: *Deleted

## 2019-05-13 NOTE — Telephone Encounter (Signed)
Reviewed inconclusive [phone] results with patient. Inconclusive due to the presence of PCR inhibitor. She will call her pcp for further information regarding results.

## 2019-07-11 ENCOUNTER — Ambulatory Visit: Payer: 59 | Admitting: Family Medicine

## 2019-07-14 ENCOUNTER — Encounter: Payer: Self-pay | Admitting: Nurse Practitioner

## 2019-07-14 DIAGNOSIS — Z975 Presence of (intrauterine) contraceptive device: Secondary | ICD-10-CM | POA: Insufficient documentation

## 2019-07-15 ENCOUNTER — Ambulatory Visit (INDEPENDENT_AMBULATORY_CARE_PROVIDER_SITE_OTHER): Payer: 59 | Admitting: Nurse Practitioner

## 2019-07-15 ENCOUNTER — Encounter: Payer: Self-pay | Admitting: Obstetrics and Gynecology

## 2019-07-15 ENCOUNTER — Other Ambulatory Visit: Payer: Self-pay

## 2019-07-15 ENCOUNTER — Encounter: Payer: Self-pay | Admitting: Nurse Practitioner

## 2019-07-15 VITALS — BP 137/85 | HR 90 | Temp 98.3°F | Ht 62.5 in | Wt 188.0 lb

## 2019-07-15 DIAGNOSIS — R58 Hemorrhage, not elsewhere classified: Secondary | ICD-10-CM

## 2019-07-15 DIAGNOSIS — N898 Other specified noninflammatory disorders of vagina: Secondary | ICD-10-CM

## 2019-07-15 DIAGNOSIS — B373 Candidiasis of vulva and vagina: Secondary | ICD-10-CM

## 2019-07-15 DIAGNOSIS — Z23 Encounter for immunization: Secondary | ICD-10-CM | POA: Diagnosis not present

## 2019-07-15 DIAGNOSIS — E669 Obesity, unspecified: Secondary | ICD-10-CM | POA: Insufficient documentation

## 2019-07-15 DIAGNOSIS — Z6833 Body mass index (BMI) 33.0-33.9, adult: Secondary | ICD-10-CM | POA: Insufficient documentation

## 2019-07-15 DIAGNOSIS — Z113 Encounter for screening for infections with a predominantly sexual mode of transmission: Secondary | ICD-10-CM | POA: Diagnosis not present

## 2019-07-15 DIAGNOSIS — Z01419 Encounter for gynecological examination (general) (routine) without abnormal findings: Secondary | ICD-10-CM | POA: Diagnosis not present

## 2019-07-15 DIAGNOSIS — N8003 Adenomyosis of the uterus: Secondary | ICD-10-CM

## 2019-07-15 DIAGNOSIS — N800 Endometriosis of uterus: Secondary | ICD-10-CM

## 2019-07-15 MED ORDER — MEGESTROL ACETATE 40 MG PO TABS
ORAL_TABLET | ORAL | 1 refills | Status: DC
Start: 2019-07-15 — End: 2019-09-19

## 2019-07-15 NOTE — Progress Notes (Signed)
GYNECOLOGY ANNUAL PREVENTATIVE CARE ENCOUNTER NOTE  Subjective:   Samantha Francis is a 38 y.o. [phone] female here for a routine annual gynecologic exam.  Current complaints:continued vaginal bleeding more than her past periods.  Bleeds twice a month and has enough bleeding to bleed through thick pad at night.   Denies abnormal vaginal bleeding, discharge, pelvic pain, problems with intercourse or other gynecologic concerns.    Gynecologic History Patient's last menstrual period was 06/30/2019 (exact date). Contraception: tubal ligation Last Pap: 04-2018. Results were: normal Has never had a mammogram  Obstetric History OB History  Gravida Para Term Preterm AB Living  4 3 2   1 3   SAB TAB Ectopic Multiple Live Births  1     0 3    # Outcome Date GA Lbr Len/2nd Weight Sex Delivery Anes PTL Lv  4 Term 04/20/16 [redacted]w[redacted]d  5 lb 13 oz (2.635 kg) M CS-LTranv Spinal, EPI  LIV  3 Para 06/10/14   6 lb 6.1 oz (2.895 kg) M CS-LTranv Spinal  LIV  2 SAB 2011          1 Term 04/10/08 [redacted]w[redacted]d  8 lb 6.5 oz (3.813 kg) F CS-LTranv EPI N LIV    Past Medical History:  Diagnosis Date  . Allergic rhinitis   . Allergy   . Anemia    not with current pregnancy  . Asthma    childhood but never used an inhaler  . GERD (gastroesophageal reflux disease)    with pregnancy  . History of uterine fibroid   . HSV (herpes simplex virus) anogenital infection   . PONV (postoperative nausea and vomiting)   . Spinal headache     Past Surgical History:  Procedure Laterality Date  . CESAREAN SECTION  2009  . CESAREAN SECTION N/A 06/10/2014   Procedure: CESAREAN SECTION;  Surgeon: Allyn Kenner, DO;  Location: [facility];  Service: Obstetrics;  Laterality: N/A;  . CESAREAN SECTION N/A 04/20/2016   Procedure: CESAREAN SECTION;  Surgeon: Donnamae Jude, MD;  Location: [facility];  Service: Obstetrics;  Laterality: N/A;  . MYOMECTOMY    . TUBAL LIGATION Bilateral 04/20/2016   Procedure: BILATERAL  TUBAL LIGATION;  Surgeon: Donnamae Jude, MD;  Location: [facility];  Service: Obstetrics;  Laterality: Bilateral;    Current Outpatient Medications on File Prior to Visit  Medication Sig Dispense Refill  . Elderberry 575 MG/5ML SYRP Take by mouth.    . ferrous sulfate (FERROUSUL) 325 (65 FE) MG tablet Take 1 tablet (325 mg total) by mouth 2 (two) times daily. (Patient not taking: Reported on 07/15/2019) 60 tablet 1  . megestrol (MEGACE) 40 MG tablet TAKE 1 TABLET BY MOUTH TWICE A DAY (Patient not taking: Reported on 07/15/2019) 30 tablet 3   No current facility-administered medications on file prior to visit.     Allergies  Allergen Reactions  . Doxycycline Nausea And Vomiting    Social History   Socioeconomic History  . Marital status: Married    Spouse name: Not on file  . Number of children: Not on file  . Years of education: Not on file  . Highest education level: Not on file  Occupational History  . Occupation: Print production planner  Social Needs  . Financial resource strain: Not on file  . Food insecurity    Worry: Not on file    Inability: Not on file  . Transportation needs    Medical: Not on file  Non-medical: Not on file  Tobacco Use  . Smoking status: Never Smoker  . Smokeless tobacco: Never Used  Substance and Sexual Activity  . Alcohol use: No    Alcohol/week: 0.0 standard drinks  . Drug use: No  . Sexual activity: Yes    Birth control/protection: Surgical    Comment: plans to discuss with provider 11/23 at appt  Lifestyle  . Physical activity    Days per week: Not on file    Minutes per session: Not on file  . Stress: Not on file  Relationships  . Social Herbalist on phone: Not on file    Gets together: Not on file    Attends religious service: Not on file    Active member of club or organization: Not on file    Attends meetings of clubs or organizations: Not on file    Relationship status: Not on file  . Intimate  partner violence    Fear of current or ex partner: Not on file    Emotionally abused: Not on file    Physically abused: Not on file    Forced sexual activity: Not on file  Other Topics Concern  . Not on file  Social History Narrative  . Not on file    Family History  Problem Relation Age of Onset  . Hyperlipidemia Mother   . Hypertension Father   . Hyperlipidemia Sister   . Hyperlipidemia Maternal Grandfather   . Hypertension Maternal Grandfather   . Ovarian cancer Maternal Aunt   . Breast cancer Cousin     The following portions of the patient's history were reviewed and updated as appropriate: allergies, current medications, past family history, past medical history, past social history, past surgical history and problem list.  Review of Systems Pertinent items noted in HPI and remainder of comprehensive ROS otherwise negative.   Objective:  BP 137/85   Pulse 90   Temp 98.3 F (36.8 C)   Ht 5' 2.5" (1.588 m)   Wt 188 lb (85.3 kg)   LMP 06/30/2019 (Exact Date)   BMI 33.84 kg/m  CONSTITUTIONAL: Well-developed, well-nourished female in no acute distress.  HENT:  Normocephalic, atraumatic, External right and left ear normal.  EYES: Conjunctivae and EOM are normal. Pupils are equal, round.  No scleral icterus.  NECK: Normal range of motion, supple, no masses.  Normal thyroid.  SKIN: Skin is warm and dry. No rash noted. Not diaphoretic. No erythema. No pallor. NEUROLOGIC: Alert and oriented to person, place, and time. Normal reflexes, muscle tone coordination. No cranial nerve deficit noted. PSYCHIATRIC: Normal mood and affect. Normal behavior. Normal judgment and thought content. CARDIOVASCULAR: Normal heart rate noted, regular rhythm RESPIRATORY: Clear to auscultation bilaterally. Effort and breath sounds normal, no problems with respiration noted. BREASTS: Symmetric in size. No masses, skin changes, nipple drainage, or lymphadenopathy. ABDOMEN: Soft, no distention noted.   No tenderness, rebound or guarding.  PELVIC: Normal appearing external genitalia; normal appearing vaginal mucosa and cervix.  No abnormal discharge noted.  Slightly enlargedl uterine size - 10 weeks size and very firm, no other palpable masses, no uterine or adnexal tenderness. MUSCULOSKELETAL: Normal range of motion. No tenderness.  No cyanosis, clubbing, or edema.    Assessment and Plan:  1. Discharge from the vagina Mostly bleeding twice a month for 7-10 days twice a month  - Cervicovaginal ancillary only( [place])  2. Bleeding related to known Adnemyosis  Consult with Dr. Rip Harbour Will increase Megace to BID -  client has been taking daily Will see again in 6 weeks to reevaluate bleeding - will schedule with MD Liletta IUD in place  - CBC  3. Women's annual routine gynecological examination Mammogram ordered  - Flu Vaccine QUAD 36+ mos IM  Will follow up results of pap smear and manage accordingly. Mammogram scheduled Routine preventative health maintenance measures emphasized. Please refer to After Visit Summary for other counseling recommendations.    Earlie Server, RN, MSN, NP-BC Nurse Practitioner, [place] for [facility]

## 2019-07-16 LAB — CERVICOVAGINAL ANCILLARY ONLY
Bacterial Vaginitis (gardnerella): NEGATIVE
Candida Glabrata: NEGATIVE
Candida Vaginitis: POSITIVE — AB
Chlamydia: NEGATIVE
Comment: NEGATIVE
Comment: NEGATIVE
Comment: NEGATIVE
Comment: NEGATIVE
Comment: NEGATIVE
Comment: NORMAL
Neisseria Gonorrhea: NEGATIVE
Trichomonas: NEGATIVE

## 2019-07-16 LAB — CBC
Hematocrit: 32.9 % — ABNORMAL LOW (ref 34.0–46.6)
Hemoglobin: 10.2 g/dL — ABNORMAL LOW (ref 11.1–15.9)
MCH: 24.3 pg — ABNORMAL LOW (ref 26.6–33.0)
MCHC: 31 g/dL — ABNORMAL LOW (ref 31.5–35.7)
MCV: 78 fL — ABNORMAL LOW (ref 79–97)
Platelets: 214 10*3/uL (ref 150–450)
RBC: 4.2 x10E6/uL (ref 3.77–5.28)
RDW: 15.6 % — ABNORMAL HIGH (ref 11.7–15.4)
WBC: 6.2 10*3/uL (ref 3.4–10.8)

## 2019-07-16 MED ORDER — FLUCONAZOLE 150 MG PO TABS: 150.0000 mg | ORAL_TABLET | Freq: Once | ORAL | 0 refills | Status: AC

## 2019-07-16 NOTE — Addendum Note (Signed)
Addended by: Virginia Rochester on: 07/16/2019 05:26 PM   Modules accepted: Orders

## 2019-07-16 NOTE — Progress Notes (Signed)
Yeast infection noted on wet prep Medication sent to pharmacy and MyChart note sent to client.  Earlie Server, RN, MSN, NP-BC Nurse Practitioner, [facility] for [employer] 07/16/2019 5:25 PM

## 2019-07-24 ENCOUNTER — Ambulatory Visit: Payer: 59 | Admitting: Family Medicine

## 2019-08-26 ENCOUNTER — Encounter: Payer: Self-pay | Admitting: Obstetrics and Gynecology

## 2019-08-26 ENCOUNTER — Encounter: Payer: Self-pay | Admitting: Family Medicine

## 2019-08-26 ENCOUNTER — Ambulatory Visit: Payer: 59 | Admitting: Family Medicine

## 2019-08-26 NOTE — Progress Notes (Signed)
Patient did not keep appointment today. She may call to reschedule.  

## 2019-09-19 ENCOUNTER — Other Ambulatory Visit: Payer: Self-pay

## 2019-09-19 MED ORDER — MEGESTROL ACETATE 40 MG PO TABS
ORAL_TABLET | ORAL | 1 refills | Status: DC
Start: 2019-09-19 — End: 2019-10-15

## 2019-09-19 NOTE — Telephone Encounter (Signed)
Received fax from [facility] on [place] requesting a refill on pt's Megace.  Routed to Earlie Server, NP for advisement.    Mel Almond, RN

## 2019-09-23 NOTE — Telephone Encounter (Signed)
Per Earlie Server, NP- refilled her prescription but we had wanted to see her in 6 weeks and she did not keep her appointment. Please call patient and help her reschedule with an MD so we can check to see how she is doing.  Called pt and informed her that we have refilled her Megace however someone from the front office will call her to schedule a virtual appt to f/u.  Pt verbalized understanding.   Mel Almond, RN 09/23/19

## 2019-10-14 ENCOUNTER — Telehealth: Payer: Self-pay | Admitting: Family Medicine

## 2019-10-14 NOTE — Telephone Encounter (Signed)
Samantha Francis was scheduled to have a MyChart visit per her request. She also would like to get a refill on her medication.

## 2019-10-15 ENCOUNTER — Other Ambulatory Visit: Payer: Self-pay | Admitting: Family Medicine

## 2019-10-15 MED ORDER — MEGESTROL ACETATE 40 MG PO TABS: ORAL_TABLET | ORAL | 2 refills | Status: DC

## 2019-11-13 ENCOUNTER — Telehealth: Payer: 59 | Admitting: Family Medicine

## 2020-05-06 ENCOUNTER — Other Ambulatory Visit: Payer: Self-pay | Admitting: Family Medicine

## 2020-09-09 ENCOUNTER — Other Ambulatory Visit: Payer: Self-pay | Admitting: Nurse Practitioner

## 2020-09-09 DIAGNOSIS — Z01419 Encounter for gynecological examination (general) (routine) without abnormal findings: Secondary | ICD-10-CM

## 2020-11-15 ENCOUNTER — Other Ambulatory Visit: Payer: Self-pay | Admitting: Family Medicine

## 2020-11-26 ENCOUNTER — Other Ambulatory Visit: Payer: Self-pay | Admitting: Family Medicine

## 2021-05-25 ENCOUNTER — Other Ambulatory Visit: Payer: Self-pay | Admitting: Family Medicine

## 2021-06-02 ENCOUNTER — Other Ambulatory Visit: Payer: Self-pay

## 2021-06-02 ENCOUNTER — Ambulatory Visit (INDEPENDENT_AMBULATORY_CARE_PROVIDER_SITE_OTHER): Payer: 59 | Admitting: Family Medicine

## 2021-06-02 ENCOUNTER — Ambulatory Visit
Admission: RE | Admit: 2021-06-02 | Discharge: 2021-06-02 | Disposition: A | Discharge status: Home / Self Care | Payer: 59 | Source: Ambulatory Visit | Attending: Family Medicine | Admitting: Family Medicine

## 2021-06-02 ENCOUNTER — Encounter: Payer: Self-pay | Admitting: Family Medicine

## 2021-06-02 ENCOUNTER — Other Ambulatory Visit (HOSPITAL_COMMUNITY)
Admission: RE | Admit: 2021-06-02 | Discharge: 2021-06-02 | Disposition: A | Discharge status: Home / Self Care | Payer: 59 | Source: Ambulatory Visit | Attending: Family Medicine | Admitting: Family Medicine

## 2021-06-02 VITALS — BP 135/86 | HR 94 | Ht 62.5 in | Wt 206.8 lb

## 2021-06-02 DIAGNOSIS — Z1231 Encounter for screening mammogram for malignant neoplasm of breast: Secondary | ICD-10-CM

## 2021-06-02 DIAGNOSIS — B009 Herpesviral infection, unspecified: Secondary | ICD-10-CM

## 2021-06-02 DIAGNOSIS — Z124 Encounter for screening for malignant neoplasm of cervix: Secondary | ICD-10-CM | POA: Insufficient documentation

## 2021-06-02 DIAGNOSIS — N8003 Adenomyosis of the uterus: Secondary | ICD-10-CM | POA: Diagnosis not present

## 2021-06-02 DIAGNOSIS — Z01419 Encounter for gynecological examination (general) (routine) without abnormal findings: Secondary | ICD-10-CM | POA: Diagnosis not present

## 2021-06-02 DIAGNOSIS — Z975 Presence of (intrauterine) contraceptive device: Secondary | ICD-10-CM | POA: Diagnosis not present

## 2021-06-02 DIAGNOSIS — Z23 Encounter for immunization: Secondary | ICD-10-CM | POA: Diagnosis not present

## 2021-06-02 DIAGNOSIS — E669 Obesity, unspecified: Secondary | ICD-10-CM

## 2021-06-02 DIAGNOSIS — N898 Other specified noninflammatory disorders of vagina: Secondary | ICD-10-CM

## 2021-06-02 DIAGNOSIS — Z113 Encounter for screening for infections with a predominantly sexual mode of transmission: Secondary | ICD-10-CM

## 2021-06-02 DIAGNOSIS — K439 Ventral hernia without obstruction or gangrene: Secondary | ICD-10-CM

## 2021-06-02 MED ORDER — NORETHINDRONE ACETATE 5 MG PO TABS: 5.0000 mg | ORAL_TABLET | Freq: Two times a day (BID) | ORAL | 5 refills | Status: DC

## 2021-06-02 NOTE — Patient Instructions (Signed)
Preventive Care 21-42 Years Old, Female Preventive care refers to lifestyle choices and visits with your health care provider that can promote health and wellness. This includes: A yearly physical exam. This is also called an annual wellness visit. Regular dental and eye exams. Immunizations. Screening for certain conditions. Healthy lifestyle choices, such as: Eating a healthy diet. Getting regular exercise. Not using drugs or products that contain nicotine and tobacco. Limiting alcohol use. What can I expect for my preventive care visit? Physical exam Your health care provider may check your: Height and weight. These may be used to calculate your BMI (body mass index). BMI is a measurement that tells if you are at a healthy weight. Heart rate and blood pressure. Body temperature. Skin for abnormal spots. Counseling Your health care provider may ask you questions about your: Past medical problems. Family's medical history. Alcohol, tobacco, and drug use. Emotional well-being. Home life and relationship well-being. Sexual activity. Diet, exercise, and sleep habits. Work and work environment. Access to firearms. Method of birth control. Menstrual cycle. Pregnancy history. What immunizations do I need? Vaccines are usually given at various ages, according to a schedule. Your health care provider will recommend vaccines for you based on your age, medical history, and lifestyle or other factors, such as travel or where you work. What tests do I need? Blood tests Lipid and cholesterol levels. These may be checked every 5 years starting at age 20. Hepatitis C test. Hepatitis B test. Screening Diabetes screening. This is done by checking your blood sugar (glucose) after you have not eaten for a while (fasting). STD (sexually transmitted disease) testing, if you are at risk. BRCA-related cancer screening. This may be done if you have a family history of breast, ovarian, tubal, or  peritoneal cancers. Pelvic exam and Pap test. This may be done every 3 years starting at age 21. Starting at age 30, this may be done every 5 years if you have a Pap test in combination with an HPV test. Talk with your health care provider about your test results, treatment options, and if necessary, the need for more tests. Follow these instructions at home: Eating and drinking  Eat a healthy diet that includes fresh fruits and vegetables, whole grains, lean protein, and low-fat dairy products. Take vitamin and mineral supplements as recommended by your health care provider. Do not drink alcohol if: Your health care provider tells you not to drink. You are pregnant, may be pregnant, or are planning to become pregnant. If you drink alcohol: Limit how much you have to 0-1 drink a day. Be aware of how much alcohol is in your drink. In the [place] one drink equals one 12 oz bottle of beer (355 mL), one 5 oz glass of wine (148 mL), or one 1 oz glass of hard liquor (44 mL). Lifestyle Take daily care of your teeth and gums. Brush your teeth every morning and night with fluoride toothpaste. Floss one time each day. Stay active. Exercise for at least 30 minutes 5 or more days each week. Do not use any products that contain nicotine or tobacco, such as cigarettes, e-cigarettes, and chewing tobacco. If you need help quitting, ask your health care provider. Do not use drugs. If you are sexually active, practice safe sex. Use a condom or other form of protection to prevent STIs (sexually transmitted infections). If you do not wish to become pregnant, use a form of birth control. If you plan to become pregnant, see your health care provider   for a prepregnancy visit. Find healthy ways to cope with stress, such as: Meditation, yoga, or listening to music. Journaling. Talking to a trusted person. Spending time with friends and family. Safety Always wear your seat belt while driving or riding in a  vehicle. Do not drive: If you have been drinking alcohol. Do not ride with someone who has been drinking. When you are tired or distracted. While texting. Wear a helmet and other protective equipment during sports activities. If you have firearms in your house, make sure you follow all gun safety procedures. Seek help if you have been physically or sexually abused. What's next? Go to your health care provider once a year for an annual wellness visit. Ask your health care provider how often you should have your eyes and teeth checked. Stay up to date on all vaccines. This information is not intended to replace advice given to you by your health care provider. Make sure you discuss any questions you have with your health care provider. Document Revised: 10/23/2020 Document Reviewed: 04/26/2018 Elsevier Patient Education  2022 Elsevier Inc.  

## 2021-06-02 NOTE — Progress Notes (Signed)
Subjective:     Samantha Francis is a 40 y.o. female and is here for a comprehensive physical exam. The patient reports problems - having vaginal irritation  On megace with her Liletta due to adenomyosis. Has weight gain, on Phentermine with minimal weight loss. Wants to change to [facility] and get into [facility]. Has a lump in her abdomen, and it is tender and she is concerned she might have a hernia. Also, wants STD testing due to her vaginal irritation.   The following portions of the patient's history were reviewed and updated as appropriate: allergies, current medications, past family history, past medical history, past social history, past surgical history, and problem list.  Review of Systems Pertinent items are noted in HPI.   Objective:    BP 135/86   Pulse 94   Ht 5' 2.5" (1.588 m)   Wt 206 lb 12.8 oz (93.8 kg)   LMP  (LMP Unknown)   BMI 37.22 kg/m  General appearance: alert, cooperative, and appears stated age Head: Normocephalic, without obvious abnormality, atraumatic Neck: no adenopathy, supple, symmetrical, trachea midline, and thyroid not enlarged, symmetric, no tenderness/mass/nodules Lungs: clear to auscultation bilaterally Breasts: normal appearance, no masses or tenderness Heart: regular rate and rhythm, S1, S2 normal, no murmur, click, rub or gallop Abdomen:  soft, diastasis noted, minimal tenderness and firmness in epigastrum in the midline, c/w possible hernia Pelvic: cervix normal in appearance, external genitalia normal, no adnexal masses or tenderness, no cervical motion tenderness, uterus normal size, shape, and consistency, and vagina normal without discharge Extremities: extremities normal, atraumatic, no cyanosis or edema Pulses: 2+ and symmetric Skin: Skin color, texture, turgor normal. No rashes or lesions Lymph nodes: Cervical, supraclavicular, and axillary nodes normal. Neurologic: Grossly normal    Assessment:     GYN female exam.      Plan:   Problem List Items Addressed This Visit       Unprioritized   Adenomyosis of uterus    Still taking her Megace 2 x daily--will change to Aygestin due to less weight impact. Work on decreasing dose to once daily as Stacie Acres may be more effective with passage of time, and may not require as much oral med.      Relevant Medications   norethindrone (AYGESTIN) 5 MG tablet   IUD (intrauterine device) in place    Continue for adenomyosis control      Obesity (BMI 35.0-39.9 without comorbidity)    Referral to [facility] PCP. Continue Phentermine. She has not had as much success as she would like. Consider [facility] referral.      Relevant Medications   phentermine (ADIPEX-P) 37.5 MG tablet   Other Relevant Orders   Ambulatory referral to Family Practice   HSV-2 infection    Valtrex prn      Relevant Medications   valACYclovir (VALTREX) 500 MG tablet   metroNIDAZOLE (FLAGYL) 500 MG tablet   Ventral hernia without obstruction or gangrene    Possibly noted on exam--will check CT with surgical referral as needed.      Relevant Orders   CT ABDOMEN WO CONTRAST   Other Visit Diagnoses     Encounter for gynecological examination without abnormal finding    -  Primary   Preventive   Relevant Orders   Cervicovaginal ancillary only( [place]) (Completed)   Screening for malignant neoplasm of cervix       Preventive   Relevant Orders   Cytology -  PAP( [place]) (Completed)   Breast cancer screening by mammogram       Preventive   Relevant Orders   MM 3D SCREEN BREAST BILATERAL   Needs flu shot       Preventive   Relevant Orders   Flu Vaccine QUAD 81mo+IM (Fluarix, Fluzone & Alfiuria Quad PF) (Completed)   Screen for STD (sexually transmitted disease)       Relevant Orders   HIV antibody (with reflex) (Completed)   Hepatitis B Surface AntiGEN (Completed)   Hepatitis C Antibody (Completed)   Vaginal discharge        Noted to have trichomonas and treated   Relevant Medications   metroNIDAZOLE (FLAGYL) 500 MG tablet         See After Visit Summary for Counseling Recommendations

## 2021-06-02 NOTE — Assessment & Plan Note (Addendum)
Still taking her Megace 2 x daily--will change to Aygestin due to less weight impact. Work on decreasing dose to once daily as Stacie Acres may be more effective with passage of time, and may not require as much oral med.

## 2021-06-03 LAB — CERVICOVAGINAL ANCILLARY ONLY
Bacterial Vaginitis (gardnerella): NEGATIVE
Candida Glabrata: NEGATIVE
Candida Vaginitis: NEGATIVE
Comment: NEGATIVE
Comment: NEGATIVE
Comment: NEGATIVE

## 2021-06-03 LAB — HEPATITIS B SURFACE ANTIGEN: Hepatitis B Surface Ag: NEGATIVE

## 2021-06-03 LAB — HEPATITIS C ANTIBODY: Hep C Virus Ab: <0.1 s/co ratio (ref 0.0–0.9)

## 2021-06-03 LAB — HIV ANTIBODY (ROUTINE TESTING W REFLEX): HIV Screen 4th Generation wRfx: NONREACTIVE

## 2021-06-04 LAB — CYTOLOGY - PAP
Chlamydia: NEGATIVE
Comment: NEGATIVE
Comment: NEGATIVE
Comment: NEGATIVE
Comment: NORMAL
Diagnosis: NEGATIVE
High risk HPV: NEGATIVE
Neisseria Gonorrhea: NEGATIVE
Trichomonas: POSITIVE — AB

## 2021-06-04 MED ORDER — METRONIDAZOLE 500 MG PO TABS: 500.0000 mg | ORAL_TABLET | Freq: Two times a day (BID) | ORAL | 0 refills | Status: DC

## 2021-06-04 NOTE — Assessment & Plan Note (Signed)
Possibly noted on exam--will check CT with surgical referral as needed.

## 2021-06-04 NOTE — Assessment & Plan Note (Signed)
Valtrex prn  

## 2021-06-04 NOTE — Assessment & Plan Note (Signed)
Referral to [facility] PCP. Continue Phentermine. She has not had as much success as she would like. Consider [facility] referral.

## 2021-06-04 NOTE — Assessment & Plan Note (Signed)
Continue for adenomyosis control

## 2021-06-12 ENCOUNTER — Ambulatory Visit
Admission: RE | Admit: 2021-06-12 | Discharge: 2021-06-12 | Disposition: A | Discharge status: Home / Self Care | Payer: 59 | Source: Ambulatory Visit | Attending: Family Medicine | Admitting: Family Medicine

## 2021-06-12 ENCOUNTER — Other Ambulatory Visit: Payer: Self-pay

## 2021-06-12 DIAGNOSIS — K439 Ventral hernia without obstruction or gangrene: Secondary | ICD-10-CM

## 2021-06-14 DIAGNOSIS — K469 Unspecified abdominal hernia without obstruction or gangrene: Secondary | ICD-10-CM

## 2021-06-25 ENCOUNTER — Other Ambulatory Visit: Payer: Self-pay

## 2021-06-25 ENCOUNTER — Ambulatory Visit (INDEPENDENT_AMBULATORY_CARE_PROVIDER_SITE_OTHER): Payer: 59 | Admitting: Surgery

## 2021-06-25 ENCOUNTER — Encounter: Payer: Self-pay | Admitting: Surgery

## 2021-06-25 VITALS — BP 143/89 | HR 97 | Temp 98.9°F | Ht 62.25 in | Wt 205.6 lb

## 2021-06-25 DIAGNOSIS — K439 Ventral hernia without obstruction or gangrene: Secondary | ICD-10-CM

## 2021-06-25 NOTE — Progress Notes (Signed)
06/25/2021  Reason for Visit:  Ventral hernia  Requesting Provider:  Darron Doom, MD  History of Present Illness: Samantha Francis is a 40 y.o. female presenting for evaluation of a ventral hernia.  The patient reports she's had a bulge above her umbilicus for several years, likely since the birth of her last child in 2017.  She denies any significant discomfort, but reports a bulging sensation.  She feels it may have grown in size.  Given the symptoms, she had a CT scan through her OB/GYN during her annual exam.  CT scan was done on 06/12/21, and this shows a small umbilical hernia, and diastasis recti.  I have personally viewed the images.  On my read, there may be an area within the diastasis that has a possible hernia, but unclear based on the images.  She does not want this to get worse.  She also feels that she's having more issues with acid reflux and is wondering if this is related to it.  Denies any fevers, chills, chest pain, shortness of breath, nausea, or vomiting.  Past Medical History: Past Medical History:  Diagnosis Date   Allergic rhinitis    Allergy    Anemia    not with current pregnancy   Asthma    childhood but never used an inhaler   GERD (gastroesophageal reflux disease)    with pregnancy   History of uterine fibroid    HSV (herpes simplex virus) anogenital infection    PONV (postoperative nausea and vomiting)    Spinal headache      Past Surgical History: Past Surgical History:  Procedure Laterality Date   CESAREAN SECTION  2009   CESAREAN SECTION N/A 06/10/2014   Procedure: CESAREAN SECTION;  Surgeon: Allyn Kenner, DO;  Location: [facility];  Service: Obstetrics;  Laterality: N/A;   CESAREAN SECTION N/A 04/20/2016   Procedure: CESAREAN SECTION;  Surgeon: Donnamae Jude, MD;  Location: [place];  Service: Obstetrics;  Laterality: N/A;   MYOMECTOMY     TUBAL LIGATION Bilateral 04/20/2016   Procedure: BILATERAL TUBAL LIGATION;  Surgeon: Donnamae Jude, MD;  Location: [place];  Service: Obstetrics;  Laterality: Bilateral;    Home Medications: Prior to Admission medications   Medication Sig Start Date End Date Taking? Authorizing Provider  [id number] 267 MG/5ML SYRP Take by mouth. Patient not taking: Reported on 06/02/2021    [provider]  ferrous sulfate (FERROUSUL) 325 (65 FE) MG tablet Take 1 tablet (325 mg total) by mouth 2 (two) times daily. Patient not taking: No sig reported 05/18/18   Donnamae Jude, MD  metroNIDAZOLE (FLAGYL) 500 MG tablet Take 1 tablet (500 mg total) by mouth 2 (two) times daily. 06/04/21   Donnamae Jude, MD  norethindrone (AYGESTIN) 5 MG tablet Take 1 tablet (5 mg total) by mouth 2 (two) times daily. 06/02/21   Donnamae Jude, MD  phentermine (ADIPEX-P) 37.5 MG tablet Take 37.5 mg by mouth daily. 04/25/21   [provider]  valACYclovir (VALTREX) 500 MG tablet valacyclovir 500 mg tablet  TAKE 1 TABLET(S) EVERY DAY BY ORAL ROUTE.    [provider]    Allergies: Allergies  Allergen Reactions   Doxycycline Nausea And Vomiting    Social History:  reports that she has never smoked. She has never used smokeless tobacco. She reports that she does not drink alcohol and does not use drugs.   Family History: Family History  Problem Relation Age of Onset  Hyperlipidemia Maternal Grandfather    Hypertension Maternal Grandfather    Hypertension Father    Hyperlipidemia Mother    Diabetes Sister    Hyperlipidemia Sister    Ovarian cancer Maternal Aunt    Diabetes Maternal Aunt    Breast cancer Cousin 57    Review of Systems: Review of Systems  Constitutional:  Negative for chills and fever.  HENT:  Negative for hearing loss.   Respiratory:  Negative for shortness of breath.   Cardiovascular:  Negative for chest pain.  Gastrointestinal:  Positive for abdominal pain and heartburn. Negative for constipation, diarrhea, nausea and vomiting.  Genitourinary:  Negative  for dysuria.  Musculoskeletal:  Negative for myalgias.  Skin:  Negative for rash.  Neurological:  Negative for dizziness.  Psychiatric/Behavioral:  Negative for depression.    Physical Exam BP (!) 143/89   Pulse 97   Temp 98.9 F (37.2 C) (Oral)   Ht 5' 2.25" (1.581 m)   Wt 205 lb 9.6 oz (93.3 kg)   LMP  (LMP Unknown)   SpO2 99%   BMI 37.30 kg/m  CONSTITUTIONAL: No acute distress HEENT:  Normocephalic, atraumatic, extraocular motion intact. NECK: Trachea is midline, and there is no jugular venous distension.  RESPIRATORY:  Lungs are clear, and breath sounds are equal bilaterally. Normal respiratory effort without pathologic use of accessory muscles. CARDIOVASCULAR: Heart is regular without murmurs, gallops, or rubs. GI: The abdomen is soft, non-distended, with mild tenderness to palpation in the epigastric area in the region of her diastasis.  The patient does have a very small umbilical hernia and I am not able to palpate a true hernia defect within the diastases.  The diastases about 5 cm wide.  MUSCULOSKELETAL:  Normal muscle strength and tone in all four extremities.  No peripheral edema or cyanosis. SKIN: Skin turgor is normal. There are no pathologic skin lesions.  NEUROLOGIC:  Motor and sensation is grossly normal.  Cranial nerves are grossly intact. PSYCH:  Alert and oriented to person, place and time. Affect is normal.  Laboratory Analysis: No recent labs  Imaging: CT scan of abdomen on 06/13/2015: IMPRESSION: Diastasis rectus with a tiny periumbilical fat containing ventral hernia.  Assessment and Plan: This is a 40 y.o. female with diastases recti and umbilical hernia.  - Discussed with the patient that on exam, she does have an umbilical hernia and does have tenderness in the epigastric area in the region of diastases which is what is bulging.  Discussed with her that typically the rectus muscles are close together only separated for a few millimeters by her case  are separated by about 5 cm with normal tissue in between.  However this tissue is an area of weakness and could potentially develop into hernia in the future.  There could potentially be a hernia within that area which is why she is having tenderness I am unable to fully palpate on exam today.  Discussed with her the natural progression of hernias which would be to get bigger and potentially have symptoms with time.  She would rather not wait until anything gets worse I would want to repair this if possible.  Discussed with her that unfortunately there is no medical way that will be able to close the defect or bring the muscles closer together and surgery would be the route of choice.  She is in agreement. - Discussed with her the role for a robotic ventral hernia repair during which time we will also plicate the diastases and  use of mesh to reinforce the repair.  Reviewed with her the surgery at length including the incisions, the risks of bleeding, infection, injury to surrounding structures, postop recovery and activity restrictions, that this is an outpatient surgery, and she is willing to proceed. - We will schedule the patient tentatively for 07/20/2021.  The patient will check with her job to make sure that she can take this time for surgery.  She will contact us if there are any changes that need to be made.  I spent 80 minutes dedicated to the care of this patient on the date of this encounter to include pre-visit review of records, face-to-face time with the patient discussing diagnosis and management, and any post-visit coordination of care.   Melvyn Neth, [facility]

## 2021-06-25 NOTE — Patient Instructions (Addendum)
Our surgery scheduler will call you within 24-48 hours to schedule your surgery. Please have the [facility] surgery sheet available when speaking with her.   Hernia, Adult   A hernia is the bulging of an organ or tissue through a weak spot in the muscles of the abdomen (abdominal wall). Hernias develop most often near the belly button (navel) or the area where the leg meets the lower abdomen (groin). Common types of hernias include: Incisional hernia. This type bulges through a scar from an abdominal surgery. Umbilical hernia. This type develops near the navel. Inguinal hernia. This type develops in the groin or scrotum. Femoral hernia. This type develops under the groin, in the upper thigh area. Hiatal hernia. This type occurs when part of the stomach slides above the muscle that separates the abdomen from the chest (diaphragm). What are the causes? This condition may be caused by: Heavy lifting. Coughing over a long period of time. Straining to have a bowel movement. Constipation can lead to straining. An incision made during an abdominal surgery. A physical problem that is present at birth (congenital defect). Being overweight or obese. Smoking. Excess fluid in the abdomen. Undescended testicles in males. What are the signs or symptoms? The main symptom is a skin-colored, rounded bulge in the area of the hernia. However, a bulge may not always be present. It may grow bigger or be more visible when you cough or strain (such as when lifting something heavy). A hernia that can be pushed back into the area (is reducible) rarely causes pain. A hernia that cannot be pushed back into the area (is incarcerated) may lose its blood supply (become strangulated). A hernia that is incarcerated may cause: Pain. Fever. Nausea and vomiting. Swelling. Constipation. How is this diagnosed? A hernia may be diagnosed based on: Your symptoms and medical history. A physical exam. Your health care provider  may ask you to cough or move in certain ways to see if the hernia becomes visible. Imaging tests, such as: X-rays. Ultrasound. CT scan. How is this treated? A hernia that is small and painless may not need to be treated. A hernia that is large or painful may be treated with surgery. Inguinal hernias may be treated with surgery to prevent incarceration or strangulation. Strangulated hernias are always treated with surgery because a lack of blood supply to the trapped organ or tissue can cause it to die. Surgery to treat a hernia involves pushing the bulge back into place and repairing the weak area of the muscle or abdominal wall. Follow these instructions at home: Activity Avoid straining. Do not lift anything that is heavier than 10 lb (4.5 kg), or the limit that you are told, until your health care provider says that it is safe. When lifting heavy objects, lift with your leg muscles, not your back muscles. Preventing constipation Take actions to prevent constipation. Constipation leads to straining with bowel movements, which can make a hernia worse or cause a hernia repair to break down. Your health care provider may recommend that you: Drink enough fluid to keep your urine pale yellow. Eat foods that are high in fiber, such as fresh fruits and vegetables, whole grains, and beans. Limit foods that are high in fat and processed sugars, such as fried or sweet foods. Take an over-the-counter or prescription medicine for constipation. General instructions When coughing, try to cough gently. You may try to push the hernia back in place by very gently pressing on it while lying down. Do not  try to force the bulge back in if it will not push in easily. If you are overweight, work with your health care provider to lose weight safely. Do not use any products that contain nicotine or tobacco, such as cigarettes and e-cigarettes. If you need help quitting, ask your health care provider. If you are  scheduled for hernia repair, watch your hernia for any changes in shape, size, or color. Tell your health care provider about any changes or new symptoms. Take over-the-counter and prescription medicines only as told by your health care provider. Keep all follow-up visits as told by your health care provider. This is important. Contact a health care provider if: You develop new pain, swelling, or redness around your hernia. You have signs of constipation, such as: Fewer bowel movements in a week than normal. Difficulty having a bowel movement. Stools that are dry, hard, or larger than normal. Get help right away if: You have a fever. You have abdomen pain that gets worse. You feel nauseous or you vomit. You cannot push the hernia back in place by very gently pressing on it while lying down. Do not try to force the bulge back in if it will not push in easily. The hernia: Changes in shape, size, or color. Feels hard or tender. These symptoms may represent a serious problem that is an emergency. Do not wait to see if the symptoms will go away. Get medical help right away. Call your local emergency services (911 in the [place] Summary A hernia is the bulging of an organ or tissue through a weak spot in the muscles of the abdomen (abdominal wall). The main symptom is a skin-colored, rounded lump (bulge) in the hernia area. However, a bulge may not always be present. It may grow bigger or more visible when you cough or strain (such as when having a bowel movement). A hernia that is small and painless may not need to be treated. A hernia that is large or painful may be treated with surgery. Surgery to treat a hernia involves pushing the bulge back into place and repairing the weak part of the abdomen. This information is not intended to replace advice given to you by your health care provider. Make sure you discuss any questions you have with your health care provider. Document Revised: 12/06/2018  Document Reviewed: 05/17/2017 Elsevier Patient Education  2021 [place] Diastasis recti is a condition in which the muscles of the abdomen (rectus abdominis muscles) become thin and separate. The result is a wider space between the muscles of the right and left abdomen (abdominal muscles). This wider space between the muscles may cause a bulge in the middle of the abdomen. This bulge may be noticed when a person is straining or when he or she sits up after lying down. Diastasis recti can affect men and women. It is most common among pregnant women, babies, people with obesity, and people who have had abdominal surgery. Exercise or surgery may help correct this condition. What are the causes? Common causes of this condition include: Pregnancy. As the uterus grows in size, it puts pressure on the abdominal muscles, causing the muscles to separate. Obesity. Excess fat puts pressure on abdominal muscles. Weight lifting. Some exercises of the abdomen. Advanced age. Genetics. Having had surgery on the abdomen before. What increases the risk? This condition is more likely to develop in: Women. Newborns, especially newborns who are born early (prematurely). What are the signs or symptoms? Common symptoms of  this condition include: A bulge in the middle of your abdomen. You will notice it most when you sit up or strain. Pain in your low back, hips, or the area between your hip bones (pelvis). Constipation. Being unable to control when you urinate (urinary incontinence). Bloating. Poor posture. How is this diagnosed? This condition is diagnosed with a physical exam. During the exam, your health care provider will ask you to lie flat on your back and do a crunch or half sit-up. If you have diastasis recti, a bulge will appear lengthwise between your abdominal muscles in the center of your abdomen. Your health care provider will measure the gap between your muscles with one of  the following: A medical device used to measure the space between two objects (caliper). A tape measure. CT scan. Ultrasound. Finger spaces. Your health care provider will measure the space using his or her fingers. How is this treated? If your muscle separation is not too large, you may not need treatment. However, if you are a woman who plans to become pregnant again, you should treat this condition before your next pregnancy. Treatment may include: Physical therapy exercises to strengthen and tighten your abdominal muscles. Lifestyle changes such as weight loss and exercise. Over-the-counter pain medicines as needed. Surgery to correct the separation. Follow these instructions at home: Activity Return to your normal activities as told by your health care provider. Ask your health care provider what activities are safe for you. Do exercises as told by your health care provider. Make sure you are doing your exercises and movements correctly when lifting weights or doing exercises using your abdominal muscles or the muscles in the center of your body that give stability (core muscles). Proper form can help to prevent this condition from happening again. General instructions If you are overweight, ask your health care provider for help with weight loss. Losing even a small amount of weight can help to improve your diastasis recti. Take over-the-counter or prescription medicines only as told by your health care provider. Do not strain. Straining can make the separation worse. Examples of straining include: Pushing hard to have a bowel movement, such as when you have constipation. Lifting heavy objects or lifting children. Standing up and sitting down. You may need to take these actions to prevent or treat constipation: Drink enough fluid to keep your urine pale yellow. Take over-the-counter or prescription medicines. Eat foods that are high in fiber, such as beans, whole grains, and fresh  fruits and vegetables. Limit foods that are high in fat and processed sugars, such as fried or sweet foods. Keep all follow-up visits. This is important. Contact a health care provider if: You notice a new bulge in your abdomen. Get help right away if: You experience severe discomfort in your abdomen. You develop severe abdominal pain along with nausea, vomiting, or a fever. Summary Diastasis recti is a condition in which the muscles of the abdomen (rectus abdominismuscles) become thin and separate. You may notice a bulge in your abdomen because the space has widened between the muscles of the right and left abdomen. The most common symptom is a bulge in the middle of your abdomen. You will notice it most when you sit up or strain. This condition is diagnosed with a physical exam. If the muscle separation is not too big, you may not need treatment. Otherwise, you may need to do physical therapy or have surgery. This information is not intended to replace advice given to you by  your health care provider. Make sure you discuss any questions you have with your health care provider. Document Revised: 04/17/2020 Document Reviewed: 04/17/2020 Elsevier Patient Education  [place].

## 2021-06-25 NOTE — H&P (View-Only) (Signed)
06/25/2021  Reason for Visit:  Ventral hernia  Requesting Provider:  Darron Doom, MD  History of Present Illness: Samantha Francis is a 40 y.o. female presenting for evaluation of a ventral hernia.  The patient reports she's had a bulge above her umbilicus for several years, likely since the birth of her last child in 2017.  She denies any significant discomfort, but reports a bulging sensation.  She feels it may have grown in size.  Given the symptoms, she had a CT scan through her OB/GYN during her annual exam.  CT scan was done on 06/12/21, and this shows a small umbilical hernia, and diastasis recti.  I have personally viewed the images.  On my read, there may be an area within the diastasis that has a possible hernia, but unclear based on the images.  She does not want this to get worse.  She also feels that she's having more issues with acid reflux and is wondering if this is related to it.  Denies any fevers, chills, chest pain, shortness of breath, nausea, or vomiting.  Past Medical History: Past Medical History:  Diagnosis Date   Allergic rhinitis    Allergy    Anemia    not with current pregnancy   Asthma    childhood but never used an inhaler   GERD (gastroesophageal reflux disease)    with pregnancy   History of uterine fibroid    HSV (herpes simplex virus) anogenital infection    PONV (postoperative nausea and vomiting)    Spinal headache      Past Surgical History: Past Surgical History:  Procedure Laterality Date   CESAREAN SECTION  2009   CESAREAN SECTION N/A 06/10/2014   Procedure: CESAREAN SECTION;  Surgeon: Allyn Kenner, DO;  Location: [facility];  Service: Obstetrics;  Laterality: N/A;   CESAREAN SECTION N/A 04/20/2016   Procedure: CESAREAN SECTION;  Surgeon: Donnamae Jude, MD;  Location: [facility];  Service: Obstetrics;  Laterality: N/A;   MYOMECTOMY     TUBAL LIGATION Bilateral 04/20/2016   Procedure: BILATERAL TUBAL LIGATION;  Surgeon: Donnamae Jude, MD;  Location: [facility];  Service: Obstetrics;  Laterality: Bilateral;    Home Medications: Prior to Admission medications   Medication Sig Start Date End Date Taking? Authorizing Provider  [id number] 852 MG/5ML SYRP Take by mouth. Patient not taking: Reported on 06/02/2021    [provider]  ferrous sulfate (FERROUSUL) 325 (65 FE) MG tablet Take 1 tablet (325 mg total) by mouth 2 (two) times daily. Patient not taking: No sig reported 05/18/18   Donnamae Jude, MD  metroNIDAZOLE (FLAGYL) 500 MG tablet Take 1 tablet (500 mg total) by mouth 2 (two) times daily. 06/04/21   Donnamae Jude, MD  norethindrone (AYGESTIN) 5 MG tablet Take 1 tablet (5 mg total) by mouth 2 (two) times daily. 06/02/21   Donnamae Jude, MD  phentermine (ADIPEX-P) 37.5 MG tablet Take 37.5 mg by mouth daily. 04/25/21   [provider]  valACYclovir (VALTREX) 500 MG tablet valacyclovir 500 mg tablet  TAKE 1 TABLET(S) EVERY DAY BY ORAL ROUTE.    [provider]    Allergies: Allergies  Allergen Reactions   Doxycycline Nausea And Vomiting    Social History:  reports that she has never smoked. She has never used smokeless tobacco. She reports that she does not drink alcohol and does not use drugs.   Family History: Family History  Problem Relation Age of Onset  Hyperlipidemia Maternal Grandfather    Hypertension Maternal Grandfather    Hypertension Father    Hyperlipidemia Mother    Diabetes Sister    Hyperlipidemia Sister    Ovarian cancer Maternal Aunt    Diabetes Maternal Aunt    Breast cancer Cousin 71    Review of Systems: Review of Systems  Constitutional:  Negative for chills and fever.  HENT:  Negative for hearing loss.   Respiratory:  Negative for shortness of breath.   Cardiovascular:  Negative for chest pain.  Gastrointestinal:  Positive for abdominal pain and heartburn. Negative for constipation, diarrhea, nausea and vomiting.  Genitourinary:  Negative  for dysuria.  Musculoskeletal:  Negative for myalgias.  Skin:  Negative for rash.  Neurological:  Negative for dizziness.  Psychiatric/Behavioral:  Negative for depression.    Physical Exam BP (!) 143/89   Pulse 97   Temp 98.9 F (37.2 C) (Oral)   Ht 5' 2.25" (1.581 m)   Wt 205 lb 9.6 oz (93.3 kg)   LMP  (LMP Unknown)   SpO2 99%   BMI 37.30 kg/m  CONSTITUTIONAL: No acute distress HEENT:  Normocephalic, atraumatic, extraocular motion intact. NECK: Trachea is midline, and there is no jugular venous distension.  RESPIRATORY:  Lungs are clear, and breath sounds are equal bilaterally. Normal respiratory effort without pathologic use of accessory muscles. CARDIOVASCULAR: Heart is regular without murmurs, gallops, or rubs. GI: The abdomen is soft, non-distended, with mild tenderness to palpation in the epigastric area in the region of her diastasis.  The patient does have a very small umbilical hernia and I am not able to palpate a true hernia defect within the diastases.  The diastases about 5 cm wide.  MUSCULOSKELETAL:  Normal muscle strength and tone in all four extremities.  No peripheral edema or cyanosis. SKIN: Skin turgor is normal. There are no pathologic skin lesions.  NEUROLOGIC:  Motor and sensation is grossly normal.  Cranial nerves are grossly intact. PSYCH:  Alert and oriented to person, place and time. Affect is normal.  Laboratory Analysis: No recent labs  Imaging: CT scan of abdomen on 06/13/2015: IMPRESSION: Diastasis rectus with a tiny periumbilical fat containing ventral hernia.  Assessment and Plan: This is a 40 y.o. female with diastases recti and umbilical hernia.  - Discussed with the patient that on exam, she does have an umbilical hernia and does have tenderness in the epigastric area in the region of diastases which is what is bulging.  Discussed with her that typically the rectus muscles are close together only separated for a few millimeters by her case  are separated by about 5 cm with normal tissue in between.  However this tissue is an area of weakness and could potentially develop into hernia in the future.  There could potentially be a hernia within that area which is why she is having tenderness I am unable to fully palpate on exam today.  Discussed with her the natural progression of hernias which would be to get bigger and potentially have symptoms with time.  She would rather not wait until anything gets worse I would want to repair this if possible.  Discussed with her that unfortunately there is no medical way that will be able to close the defect or bring the muscles closer together and surgery would be the route of choice.  She is in agreement. - Discussed with her the role for a robotic ventral hernia repair during which time we will also plicate the diastases and  use of mesh to reinforce the repair.  Reviewed with her the surgery at length including the incisions, the risks of bleeding, infection, injury to surrounding structures, postop recovery and activity restrictions, that this is an outpatient surgery, and she is willing to proceed. - We will schedule the patient tentatively for 07/20/2021.  The patient will check with her job to make sure that she can take this time for surgery.  She will contact us if there are any changes that need to be made.  I spent 80 minutes dedicated to the care of this patient on the date of this encounter to include pre-visit review of records, face-to-face time with the patient discussing diagnosis and management, and any post-visit coordination of care.   Melvyn Neth, [facility]

## 2021-06-28 ENCOUNTER — Telehealth: Payer: Self-pay | Admitting: Surgery

## 2021-06-28 NOTE — Telephone Encounter (Signed)
Outgoing call is made, left message for patient to call.  Please inform patient of Pre-Admission date/time, COVID Testing date and Surgery date.  Surgery Date: 07/20/21 Preadmission Testing Date: 07/12/21 (phone 8a-1p) Covid Testing Date: Not needed.     Also patient will need to call at [phone], between 1-3:00pm the day before surgery, to find out what time to arrive for surgery.

## 2021-06-29 NOTE — Telephone Encounter (Signed)
Outgoing call is made again, this time was able to speak with patient.  She is now aware of all dates regarding surgery and verbalized understanding.

## 2021-07-12 ENCOUNTER — Other Ambulatory Visit: Admission: RE | Admit: 2021-07-12 | Payer: 59 | Source: Ambulatory Visit

## 2021-07-14 ENCOUNTER — Other Ambulatory Visit: Payer: Self-pay

## 2021-07-14 ENCOUNTER — Encounter
Admission: RE | Admit: 2021-07-14 | Discharge: 2021-07-14 | Disposition: A | Discharge status: Home / Self Care | Payer: 59 | Source: Ambulatory Visit | Attending: Surgery | Admitting: Surgery

## 2021-07-14 NOTE — Patient Instructions (Signed)
Your procedure is scheduled on: Tuesday July 20, 2021. Report to Day Surgery inside [facility] 2nd floor stop by admissions desk before getting on elevator. To find out your arrival time please call [phone] between 1PM - 3PM on Monday July 19, 2021.  Remember: Instructions that are not followed completely may result in serious medical risk,  up to and including death, or upon the discretion of your surgeon and anesthesiologist your  surgery may need to be rescheduled.     _X__ 1. Do not eat food after midnight the night before your procedure.                 No chewing gum or hard candies. You may drink clear liquids up to 2 hours                 before you are scheduled to arrive for your surgery- DO not drink clear                 liquids within 2 hours of the start of your surgery.                 Clear Liquids include:  water, apple juice without pulp, clear Gatorade, G2 or                  Gatorade Zero (avoid Red/Purple/Blue), Black Coffee or Tea (Do not add                 anything to coffee or tea).  __X__2.  On the morning of surgery brush your teeth with toothpaste and water, you                may rinse your mouth with mouthwash if you wish.  Do not swallow any toothpaste or mouthwash.     _X__ 3.  No Alcohol for 24 hours before or after surgery.   _X__ 4.  Do Not Smoke or use e-cigarettes For 24 Hours Prior to Your Surgery.                 Do not use any chewable tobacco products for at least 6 hours prior to                 Surgery.  _X__  5.  Do not use any recreational drugs (marijuana, cocaine, heroin, ecstasy, MDMA or other)                For at least one week prior to your surgery.  Combination of these drugs with anesthesia                May have life threatening results.  __X__6.  Notify your doctor if there is any change in your medical condition      (cold, fever, infections).     Do not wear jewelry, make-up, hairpins,  clips or nail polish. Do not wear lotions, powders, or perfumes. You may wear deodorant. Do not shave 48 hours prior to surgery.  Do not bring valuables to the hospital.    [facility] is not responsible for any belongings or valuables.  Contacts, dentures or bridgework may not be worn into surgery. Leave your suitcase in the car. After surgery it may be brought to your room. For patients admitted to the hospital, discharge time is determined by your treatment team.   Patients discharged the day of surgery will not be allowed to drive home.   Make arrangements for someone  to be with you for the first 24 hours of your Same Day Discharge.   __X__ Take these medicines the morning of surgery with A SIP OF WATER:    1. None   2.   3.   4.  5.  6.  ____ Fleet Enema (as directed)   __X__ Use CHG Soap (or wipes) as directed  ____ Use Benzoyl Peroxide Gel as instructed  ____ Use inhalers on the day of surgery  ____ Stop metformin 2 days prior to surgery    ____ Take 1/2 of usual insulin dose the night before surgery. No insulin the morning          of surgery.   ____ Call your PCP, cardiologist, or Pulmonologist if taking Coumadin/Plavix/aspirin and ask when to stop before your surgery.   __X__ One Week prior to surgery- Stop Anti-inflammatories such as Ibuprofen, Aleve, Advil, Motrin, meloxicam (MOBIC), diclofenac, etodolac, ketorolac, Toradol, Daypro, piroxicam, Goody's or BC powders. OK TO USE TYLENOL IF NEEDED   __X__ Stop supplements until after surgery.    ____ Bring C-Pap to the hospital.    If you have any questions regarding your pre-procedure instructions,  Please call Pre-admit Testing at [phone].

## 2021-07-19 MED ORDER — FAMOTIDINE 20 MG PO TABS
20.0000 mg | ORAL_TABLET | Freq: Once | ORAL | Status: AC
  Administered 2021-07-20: 20 mg via ORAL

## 2021-07-19 MED ORDER — LACTATED RINGERS IV SOLN: INTRAVENOUS | Status: DC

## 2021-07-19 MED ORDER — GABAPENTIN 300 MG PO CAPS
300.0000 mg | ORAL_CAPSULE | ORAL | Status: AC
  Administered 2021-07-20: 300 mg via ORAL

## 2021-07-19 MED ORDER — CEFAZOLIN SODIUM-DEXTROSE 2-4 GM/100ML-% IV SOLN
2.0000 g | INTRAVENOUS | Status: AC
  Administered 2021-07-20: 2 g via INTRAVENOUS

## 2021-07-19 MED ORDER — BUPIVACAINE LIPOSOME 1.3 % IJ SUSP: 20.0000 mL | Freq: Once | INTRAMUSCULAR | Status: DC

## 2021-07-19 MED ORDER — CHLORHEXIDINE GLUCONATE CLOTH 2 % EX PADS: 6.0000 | MEDICATED_PAD | Freq: Once | CUTANEOUS | Status: DC

## 2021-07-19 MED ORDER — ACETAMINOPHEN 500 MG PO TABS
1000.0000 mg | ORAL_TABLET | ORAL | Status: AC
Start: 2021-07-20 — End: 2021-07-20
  Administered 2021-07-20: 1000 mg via ORAL

## 2021-07-19 MED ORDER — ORAL CARE MOUTH RINSE: 15.0000 mL | Freq: Once | OROMUCOSAL | Status: AC

## 2021-07-19 MED ORDER — CHLORHEXIDINE GLUCONATE 0.12 % MT SOLN
15.0000 mL | Freq: Once | OROMUCOSAL | Status: AC
  Administered 2021-07-20: 15 mL via OROMUCOSAL

## 2021-07-20 ENCOUNTER — Ambulatory Visit: Payer: 59 | Admitting: Anesthesiology

## 2021-07-20 ENCOUNTER — Encounter
Admission: RE | Disposition: A | Discharge status: Home / Self Care | Payer: Self-pay | Source: Ambulatory Visit | Attending: Surgery

## 2021-07-20 ENCOUNTER — Other Ambulatory Visit: Payer: Self-pay

## 2021-07-20 ENCOUNTER — Encounter: Payer: Self-pay | Admitting: Surgery

## 2021-07-20 ENCOUNTER — Ambulatory Visit
Admission: RE | Admit: 2021-07-20 | Discharge: 2021-07-20 | Disposition: A | Discharge status: Home / Self Care | Payer: 59 | Source: Ambulatory Visit | Attending: Surgery | Admitting: Surgery

## 2021-07-20 DIAGNOSIS — M6200 Separation of muscle (nontraumatic), unspecified site: Secondary | ICD-10-CM | POA: Diagnosis not present

## 2021-07-20 DIAGNOSIS — K219 Gastro-esophageal reflux disease without esophagitis: Secondary | ICD-10-CM | POA: Insufficient documentation

## 2021-07-20 DIAGNOSIS — J45909 Unspecified asthma, uncomplicated: Secondary | ICD-10-CM | POA: Insufficient documentation

## 2021-07-20 DIAGNOSIS — M6208 Separation of muscle (nontraumatic), other site: Secondary | ICD-10-CM | POA: Diagnosis not present

## 2021-07-20 DIAGNOSIS — K436 Other and unspecified ventral hernia with obstruction, without gangrene: Secondary | ICD-10-CM | POA: Diagnosis not present

## 2021-07-20 DIAGNOSIS — K429 Umbilical hernia without obstruction or gangrene: Secondary | ICD-10-CM | POA: Diagnosis not present

## 2021-07-20 DIAGNOSIS — M1990 Unspecified osteoarthritis, unspecified site: Secondary | ICD-10-CM | POA: Diagnosis not present

## 2021-07-20 DIAGNOSIS — K439 Ventral hernia without obstruction or gangrene: Secondary | ICD-10-CM

## 2021-07-20 DIAGNOSIS — R519 Headache, unspecified: Secondary | ICD-10-CM | POA: Insufficient documentation

## 2021-07-20 HISTORY — PX: XI ROBOTIC ASSISTED VENTRAL HERNIA: SHX6789

## 2021-07-20 LAB — POCT PREGNANCY, URINE: Preg Test, Ur: NEGATIVE

## 2021-07-20 SURGERY — REPAIR, HERNIA, VENTRAL, ROBOT-ASSISTED
Anesthesia: General | Site: Abdomen

## 2021-07-20 MED ORDER — OXYCODONE HCL 5 MG PO TABS: 5.0000 mg | ORAL_TABLET | ORAL | 0 refills | Status: DC | PRN

## 2021-07-20 MED ORDER — IBUPROFEN 600 MG PO TABS: 600.0000 mg | ORAL_TABLET | Freq: Three times a day (TID) | ORAL | 1 refills | Status: DC | PRN

## 2021-07-20 MED ORDER — ACETAMINOPHEN 500 MG PO TABS
ORAL_TABLET | ORAL | Status: AC
  Filled 2021-07-20: qty 2

## 2021-07-20 MED ORDER — ESMOLOL HCL 100 MG/10ML IV SOLN
INTRAVENOUS | Status: DC | PRN
  Administered 2021-07-20: 20 mg via INTRAVENOUS

## 2021-07-20 MED ORDER — MIDAZOLAM HCL 2 MG/2ML IJ SOLN
INTRAMUSCULAR | Status: DC | PRN
  Administered 2021-07-20: 2 mg via INTRAVENOUS

## 2021-07-20 MED ORDER — ONDANSETRON HCL 4 MG/2ML IJ SOLN
INTRAMUSCULAR | Status: DC | PRN
  Administered 2021-07-20: 4 mg via INTRAVENOUS

## 2021-07-20 MED ORDER — BUPIVACAINE LIPOSOME 1.3 % IJ SUSP
INTRAMUSCULAR | Status: AC
  Filled 2021-07-20: qty 20

## 2021-07-20 MED ORDER — SUGAMMADEX SODIUM 200 MG/2ML IV SOLN
INTRAVENOUS | Status: DC | PRN
  Administered 2021-07-20: 200 mg via INTRAVENOUS

## 2021-07-20 MED ORDER — FENTANYL CITRATE (PF) 100 MCG/2ML IJ SOLN
INTRAMUSCULAR | Status: AC
  Filled 2021-07-20: qty 2

## 2021-07-20 MED ORDER — DEXAMETHASONE SODIUM PHOSPHATE 10 MG/ML IJ SOLN
INTRAMUSCULAR | Status: DC | PRN
  Administered 2021-07-20: 10 mg via INTRAVENOUS

## 2021-07-20 MED ORDER — OXYCODONE HCL 5 MG PO TABS
5.0000 mg | ORAL_TABLET | Freq: Once | ORAL | Status: AC | PRN
  Administered 2021-07-20: 5 mg via ORAL

## 2021-07-20 MED ORDER — LIDOCAINE HCL (CARDIAC) PF 100 MG/5ML IV SOSY
PREFILLED_SYRINGE | INTRAVENOUS | Status: DC | PRN
  Administered 2021-07-20: 100 mg via INTRAVENOUS

## 2021-07-20 MED ORDER — SODIUM CHLORIDE FLUSH 0.9 % IV SOLN
INTRAVENOUS | Status: AC
  Filled 2021-07-20: qty 10

## 2021-07-20 MED ORDER — ACETAMINOPHEN 500 MG PO TABS: 1000.0000 mg | ORAL_TABLET | Freq: Four times a day (QID) | ORAL | Status: DC | PRN

## 2021-07-20 MED ORDER — DEXMEDETOMIDINE (PRECEDEX) IN NS 20 MCG/5ML (4 MCG/ML) IV SYRINGE
PREFILLED_SYRINGE | INTRAVENOUS | Status: DC | PRN
  Administered 2021-07-20: 8 ug via INTRAVENOUS
  Administered 2021-07-20: 12 ug via INTRAVENOUS
  Administered 2021-07-20: 20 ug via INTRAVENOUS

## 2021-07-20 MED ORDER — MIDAZOLAM HCL 2 MG/2ML IJ SOLN
INTRAMUSCULAR | Status: AC
  Filled 2021-07-20: qty 2

## 2021-07-20 MED ORDER — FENTANYL CITRATE (PF) 100 MCG/2ML IJ SOLN: 25.0000 ug | INTRAMUSCULAR | Status: DC | PRN

## 2021-07-20 MED ORDER — PHENYLEPHRINE HCL (PRESSORS) 10 MG/ML IV SOLN
INTRAVENOUS | Status: DC | PRN
  Administered 2021-07-20: 160 ug via INTRAVENOUS
  Administered 2021-07-20 (×2): 80 ug via INTRAVENOUS
  Administered 2021-07-20: 160 ug via INTRAVENOUS

## 2021-07-20 MED ORDER — ROCURONIUM BROMIDE 100 MG/10ML IV SOLN
INTRAVENOUS | Status: DC | PRN
  Administered 2021-07-20: 50 mg via INTRAVENOUS
  Administered 2021-07-20: 20 mg via INTRAVENOUS
  Administered 2021-07-20: 10 mg via INTRAVENOUS
  Administered 2021-07-20: 20 mg via INTRAVENOUS
  Administered 2021-07-20: 15 mg via INTRAVENOUS

## 2021-07-20 MED ORDER — KETOROLAC TROMETHAMINE 30 MG/ML IJ SOLN
INTRAMUSCULAR | Status: DC | PRN
  Administered 2021-07-20: 30 mg via INTRAVENOUS

## 2021-07-20 MED ORDER — FENTANYL CITRATE (PF) 100 MCG/2ML IJ SOLN
INTRAMUSCULAR | Status: DC | PRN
  Administered 2021-07-20 (×4): 50 ug via INTRAVENOUS

## 2021-07-20 MED ORDER — OXYCODONE HCL 5 MG PO TABS
ORAL_TABLET | ORAL | Status: AC
  Filled 2021-07-20: qty 1

## 2021-07-20 MED ORDER — CHLORHEXIDINE GLUCONATE 0.12 % MT SOLN
OROMUCOSAL | Status: AC
  Filled 2021-07-20: qty 15

## 2021-07-20 MED ORDER — CEFAZOLIN SODIUM-DEXTROSE 2-4 GM/100ML-% IV SOLN
INTRAVENOUS | Status: AC
  Filled 2021-07-20: qty 100

## 2021-07-20 MED ORDER — PROPOFOL 10 MG/ML IV BOLUS
INTRAVENOUS | Status: DC | PRN
  Administered 2021-07-20: 200 mg via INTRAVENOUS

## 2021-07-20 MED ORDER — BUPIVACAINE-EPINEPHRINE (PF) 0.25% -1:200000 IJ SOLN
INTRAMUSCULAR | Status: AC
  Filled 2021-07-20: qty 30

## 2021-07-20 MED ORDER — OXYCODONE HCL 5 MG/5ML PO SOLN: 5.0000 mg | Freq: Once | ORAL | Status: AC | PRN

## 2021-07-20 MED ORDER — GLYCOPYRROLATE 0.2 MG/ML IJ SOLN
INTRAMUSCULAR | Status: DC | PRN
Start: 2021-07-20 — End: 2021-07-20
  Administered 2021-07-20: .2 mg via INTRAVENOUS

## 2021-07-20 MED ORDER — BUPIVACAINE-EPINEPHRINE 0.25% -1:200000 IJ SOLN
INTRAMUSCULAR | Status: DC | PRN
  Administered 2021-07-20: 50 mL via SURGICAL_CAVITY

## 2021-07-20 MED ORDER — FAMOTIDINE 20 MG PO TABS
ORAL_TABLET | ORAL | Status: AC
  Filled 2021-07-20: qty 1

## 2021-07-20 MED ORDER — GABAPENTIN 300 MG PO CAPS
ORAL_CAPSULE | ORAL | Status: AC
  Filled 2021-07-20: qty 1

## 2021-07-20 SURGICAL SUPPLY — 68 items
ADH SKN CLS APL DERMABOND .7 (GAUZE/BANDAGES/DRESSINGS) ×1
APL PRP STRL LF DISP 70% ISPRP (MISCELLANEOUS)
BLADE SURG SZ11 CARB STEEL (BLADE) ×2 IMPLANT
CANNULA REDUC XI 12-8 STAPL (CANNULA) ×2
CANNULA REDUCER 12-8 DVNC XI (CANNULA) ×1 IMPLANT
CHLORAPREP W/TINT 26 (MISCELLANEOUS) ×1 IMPLANT
COVER TIP SHEARS 8 DVNC (MISCELLANEOUS) ×1 IMPLANT
COVER TIP SHEARS 8MM DA VINCI (MISCELLANEOUS) ×2
DEFOGGER SCOPE WARMER CLEARIFY (MISCELLANEOUS) ×2 IMPLANT
DERMABOND ADVANCED (GAUZE/BANDAGES/DRESSINGS) ×1
DERMABOND ADVANCED .7 DNX12 (GAUZE/BANDAGES/DRESSINGS) ×1 IMPLANT
DEVICE SECURE STRAP 25 ABSORB (INSTRUMENTS) ×1 IMPLANT
DRAPE ARM DVNC X/XI (DISPOSABLE) ×3 IMPLANT
DRAPE COLUMN DVNC XI (DISPOSABLE) ×1 IMPLANT
DRAPE DA VINCI XI ARM (DISPOSABLE) ×6
DRAPE DA VINCI XI COLUMN (DISPOSABLE) ×2
ELECT CAUTERY BLADE TIP 2.5 (TIP) ×2
ELECT REM PT RETURN 9FT ADLT (ELECTROSURGICAL) ×2
ELECTRODE CAUTERY BLDE TIP 2.5 (TIP) ×1 IMPLANT
ELECTRODE REM PT RTRN 9FT ADLT (ELECTROSURGICAL) ×1 IMPLANT
GAUZE 4X4 16PLY ~~LOC~~+RFID DBL (SPONGE) ×2 IMPLANT
GLOVE SURG SYN 7.0 (GLOVE) ×10 IMPLANT
GLOVE SURG SYN 7.0 PF PI (GLOVE) ×2 IMPLANT
GLOVE SURG SYN 7.5  E (GLOVE) ×10
GLOVE SURG SYN 7.5 E (GLOVE) ×5 IMPLANT
GLOVE SURG SYN 7.5 PF PI (GLOVE) ×2 IMPLANT
GOWN STRL REUS W/ TWL LRG LVL3 (GOWN DISPOSABLE) ×3 IMPLANT
GOWN STRL REUS W/TWL LRG LVL3 (GOWN DISPOSABLE) ×10
GRASPER SUT TROCAR 14GX15 (MISCELLANEOUS) ×2 IMPLANT
IRRIGATION STRYKERFLOW (MISCELLANEOUS) IMPLANT
IRRIGATOR STRYKERFLOW (MISCELLANEOUS)
IV NS 1000ML (IV SOLUTION)
IV NS 1000ML BAXH (IV SOLUTION) IMPLANT
KIT PINK PAD W/HEAD ARE REST (MISCELLANEOUS) ×2
KIT PINK PAD W/HEAD ARM REST (MISCELLANEOUS) ×1 IMPLANT
L-HOOK LAP DISP 36CM (ELECTROSURGICAL) ×2
LABEL OR SOLS (LABEL) ×2 IMPLANT
LHOOK LAP DISP 36CM (ELECTROSURGICAL) IMPLANT
MANIFOLD NEPTUNE II (INSTRUMENTS) ×2 IMPLANT
MESH VENT LT ST 10.2X15.2CM EL (Mesh General) ×1 IMPLANT
NDL INSUFFLATION 14GA 120MM (NEEDLE) ×1 IMPLANT
NEEDLE HYPO 22GX1.5 SAFETY (NEEDLE) ×2 IMPLANT
NEEDLE INSUFFLATION 14GA 120MM (NEEDLE) ×2 IMPLANT
OBTURATOR OPTICAL STANDARD 8MM (TROCAR) ×2
OBTURATOR OPTICAL STND 8 DVNC (TROCAR) ×1
OBTURATOR OPTICALSTD 8 DVNC (TROCAR) ×1 IMPLANT
PACK LAP CHOLECYSTECTOMY (MISCELLANEOUS) ×2 IMPLANT
PENCIL ELECTRO HAND CTR (MISCELLANEOUS) ×2 IMPLANT
POUCH ENDO CATCH 10MM SPEC (MISCELLANEOUS) ×1 IMPLANT
SEAL CANN UNIV 5-8 DVNC XI (MISCELLANEOUS) ×2 IMPLANT
SEAL XI 5MM-8MM UNIVERSAL (MISCELLANEOUS) ×4
SET TUBE SMOKE EVAC HIGH FLOW (TUBING) ×2 IMPLANT
SOLUTION ELECTROLUBE (MISCELLANEOUS) ×2 IMPLANT
SPONGE T-LAP 18X18 ~~LOC~~+RFID (SPONGE) ×2 IMPLANT
STAPLER CANNULA SEAL DVNC XI (STAPLE) ×1 IMPLANT
STAPLER CANNULA SEAL XI (STAPLE) ×2
SUT MNCRL 4-0 (SUTURE) ×2
SUT MNCRL 4-0 27XMFL (SUTURE) ×1
SUT STRATAFIX PDS 30 CT-1 (SUTURE) ×4 IMPLANT
SUT VICRYL 0 AB UR-6 (SUTURE) ×4 IMPLANT
SUT VICRYL 2-0 SH 8X27 (SUTURE) ×1 IMPLANT
SUT VLOC 90 2/L VL 12 GS22 (SUTURE) ×5 IMPLANT
SUTURE MNCRL 4-0 27XMF (SUTURE) ×1 IMPLANT
SYR 20ML LL LF (SYRINGE) ×1 IMPLANT
TRAY FOLEY SLVR 16FR LF STAT (SET/KITS/TRAYS/PACK) ×2 IMPLANT
TROCAR XCEL NON-BLD 5MMX100MML (ENDOMECHANICALS) ×1 IMPLANT
WAND RF SURG SPNG DETECT SYS (INSTRUMENTS) ×2 IMPLANT
WATER STERILE IRR 500ML POUR (IV SOLUTION) ×1 IMPLANT

## 2021-07-20 NOTE — Anesthesia Preprocedure Evaluation (Signed)
Anesthesia Evaluation  Patient identified by MRN, date of birth, ID band Patient awake    Reviewed: Allergy & Precautions, NPO status , Patient's Chart, lab work & pertinent test results  History of Anesthesia Complications (+) PONV, POST - OP SPINAL HEADACHE and history of anesthetic complications  Airway Mallampati: II  TM Distance: >3 FB Neck ROM: full    Dental  (+) Chipped   Pulmonary neg shortness of breath, asthma ,    Pulmonary exam normal        Cardiovascular Exercise Tolerance: Good (-) angina(-) Past MI and (-) DOE negative cardio ROS Normal cardiovascular exam     Neuro/Psych  Headaches, negative psych ROS   GI/Hepatic Neg liver ROS, GERD  Medicated and Controlled,  Endo/Other  negative endocrine ROS  Renal/GU      Musculoskeletal  (+) Arthritis ,   Abdominal   Peds  Hematology negative hematology ROS (+)   Anesthesia Other Findings Past Medical History: No date: Allergic rhinitis No date: Allergy No date: Anemia     Comment:  not with current pregnancy No date: Asthma     Comment:  childhood but never used an inhaler No date: GERD (gastroesophageal reflux disease)     Comment:  with pregnancy No date: History of uterine fibroid No date: HSV (herpes simplex virus) anogenital infection No date: PONV (postoperative nausea and vomiting) No date: Spinal headache  Past Surgical History: 2009: CESAREAN SECTION 06/10/2014: CESAREAN SECTION; N/A     Comment:  Procedure: CESAREAN SECTION;  Surgeon: Allyn Kenner,               DO;  Location: [facility];  Service: Obstetrics;  Laterality:              N/A; 04/20/2016: CESAREAN SECTION; N/A     Comment:  Procedure: CESAREAN SECTION;  Surgeon: Donnamae Jude,               MD;  Location: [facility];  Service: Obstetrics;               Laterality: N/A; No date: MYOMECTOMY 04/20/2016: TUBAL LIGATION; Bilateral     Comment:  Procedure: BILATERAL  TUBAL LIGATION;  Surgeon: Donnamae Jude, MD;  Location: [facility];  Service:               Obstetrics;  Laterality: Bilateral;  BMI    Body Mass Index: 37.49 kg/m      Reproductive/Obstetrics negative OB ROS                             Anesthesia Physical Anesthesia Plan  ASA: 3  Anesthesia Plan: General ETT   Post-op Pain Management:    Induction: Intravenous  PONV Risk Score and Plan: Ondansetron, Dexamethasone, Midazolam and Treatment may vary due to age or medical condition  Airway Management Planned: Oral ETT  Additional Equipment:   Intra-op Plan:   Post-operative Plan: Extubation in OR  Informed Consent: I have reviewed the patients History and Physical, chart, labs and discussed the procedure including the risks, benefits and alternatives for the proposed anesthesia with the patient or authorized representative who has indicated his/her understanding and acceptance.     Dental Advisory Given  Plan Discussed with: Anesthesiologist, CRNA and Surgeon  Anesthesia Plan Comments: (Patient consented for risks of anesthesia including but not limited to:  -  adverse reactions to medications - damage to eyes, teeth, lips or other oral mucosa - nerve damage due to positioning  - sore throat or hoarseness - Damage to heart, brain, nerves, lungs, other parts of body or loss of life  Patient voiced understanding.)        Anesthesia Quick Evaluation

## 2021-07-20 NOTE — Discharge Instructions (Signed)
AMBULATORY SURGERY  DISCHARGE INSTRUCTIONS   The drugs that you were given will stay in your system until tomorrow so for the next 24 hours you should not:  Drive an automobile Make any legal decisions Drink any alcoholic beverage   You may resume regular meals tomorrow.  Today it is better to start with liquids and gradually work up to solid foods.  You may eat anything you prefer, but it is better to start with liquids, then soup and crackers, and gradually work up to solid foods.   Please notify your doctor immediately if you have any unusual bleeding, trouble breathing, redness and pain at the surgery site, drainage, fever, or pain not relieved by medication.    Your post-operative visit with Dr.                                       is: Date:                        Time:    Please call to schedule your post-operative visit.  Additional Instructions:  LEAVE GREEN ARM BAND ON FOR 4 DAYS

## 2021-07-20 NOTE — Interval H&P Note (Signed)
History and Physical Interval Note:  07/20/2021 11:49 AM  Samantha Francis  has presented today for surgery, with the diagnosis of ventral hernia.  The various methods of treatment have been discussed with the patient and family. After consideration of risks, benefits and other options for treatment, the patient has consented to  Procedure(s): XI ROBOTIC [place] (N/A) as a surgical intervention.  The patient's history has been reviewed, patient examined, no change in status, stable for surgery.  I have reviewed the patient's chart and labs.  Questions were answered to the patient's satisfaction.     Verne Cove

## 2021-07-20 NOTE — Transfer of Care (Signed)
Immediate Anesthesia Transfer of Care Note  Patient: Samantha Francis  Procedure(s) Performed: XI ROBOTIC ASSISTED VENTRAL HERNIA (Abdomen)  Patient Location: PACU  Anesthesia Type:General  Level of Consciousness: drowsy  Airway & Oxygen Therapy: Patient Spontanous Breathing and Patient connected to face mask oxygen  Post-op Assessment: Report given to RN and Post -op Vital signs reviewed and stable  Post vital signs: Reviewed and stable  Last Vitals:  Vitals Value Taken Time  BP 111/70 07/20/21 1841  Temp 36.7 C 07/20/21 1841  Pulse 105 07/20/21 1842  Resp 22 07/20/21 1842  SpO2 100 % 07/20/21 1842  Vitals shown include unvalidated device data.  Last Pain: There were no vitals filed for this visit.       Complications: No notable events documented.

## 2021-07-20 NOTE — Anesthesia Procedure Notes (Signed)
Procedure Name: Intubation Date/Time: 07/20/2021 2:43 PM Performed by: Kelton Pillar, CRNA Pre-anesthesia Checklist: Patient identified, Emergency Drugs available, Suction available and Patient being monitored Patient Re-evaluated:Patient Re-evaluated prior to induction Oxygen Delivery Method: Circle system utilized Preoxygenation: Pre-oxygenation with 100% oxygen Induction Type: IV induction Ventilation: Mask ventilation without difficulty Laryngoscope Size: McGraph and 1 Grade View: Grade I Tube type: Oral Tube size: 7.0 mm Number of attempts: 1 Airway Equipment and Method: Stylet and Oral airway Placement Confirmation: ETT inserted through vocal cords under direct vision, positive ETCO2, breath sounds checked- equal and bilateral and CO2 detector Secured at: 21 cm Tube secured with: Tape Dental Injury: Teeth and Oropharynx as per pre-operative assessment

## 2021-07-20 NOTE — Op Note (Addendum)
Procedure Date:  07/20/2021  Pre-operative Diagnosis:  Umbilical hernia and diastasis recti  Post-operative Diagnosis:  Ventral hernias x 4 (epigastric hernias were incarcerated); diastasis recti  Procedure:  Robotic assisted Ventral Hernia Repair with mesh  Surgeon:  Melvyn Neth, MD  Anesthesia:  General endotracheal  Estimated Blood Loss:  10 ml  Specimens:  None  Complications:  None  Indications for Procedure:  This is a 40 y.o. female who presents with initial concern for an umbilical hernia and diastasis recti.  The options of surgery versus observation were reviewed with the patient and/or family. The risks of bleeding, abscess or infection, recurrence of symptoms, potential for an open procedure, injury to surrounding structures, and chronic pain were all discussed with the patient and was willing to proceed.  Description of Procedure: The patient was correctly identified in the preoperative area and brought into the operating room.  The patient was placed supine with VTE prophylaxis in place.  Appropriate time-outs were performed.  Anesthesia was induced and the patient was intubated.  Appropriate antibiotics were infused.  The abdomen was prepped and draped in a sterile fashion. The patient's hernia defect was marked with a marking pen.  A Veress needle was introduced in the left upper quadrant but pneumoperitoneum could not be well obtained despite of saline column dropping easily.  I did not want to risk organ injury by trying to push the veress needle deeper.  Instead, an incision was made in the left lateral abdomen, and using Optiview technique, an 8 mm port was introduced in the left lateral abdominal wall without complications.  Then pneumoperitoneum was obtained without complication.  Then, a 12 mm port was introduced in the left upper quadrant.  There were adhesions of the omentum to the left lower quadrant and this was taken down with cautery to make room for an  addition 8 mm port in the left lower quadrant under direct visualization.  The DaVinci platform was docked, camera targeted, and instruments placed under direct visualization.  We started with dissection by incising the peritoneum and dissecting in the preperitoneal space starting from the falciform ligament and moving inferiorly.  This revealed a total of 4 hernia defects.  There were two defects that were side by side in the epigastric region containing fat which were reduced without complication.  Then, an umbilical defect which was also reduced and contained fat, and an infraumbilical defect also containing fat.  After all hernias were reduced, the total length from superior to inferior was 8 cm.  A 4 x6 inch Bard Ventralight ST Echo mesh, two 0 Stratafix suture, and three 2-0 V-loc sutures were inserted through the 12 mm port under direct visualization.    The hernia defects were closed using the stratafix sutures, while at the same time plicating the diastasis.  A PMI was brought through the center of the hernia defects and the positioning system of the mesh was passed through and insufflated.  This allowed the mesh to splay open and be centered over the repair site with good overlap.  The mesh was then sutured in place circumferentially and through the center of the mesh using the V-loc sutures.  However, the camera port ended up being too close to the edge of the mesh and I was no able to see the mesh well to be able to suture that left lateral edge appropriately.  Despite of burping the port more, there was not good visibility or maneuverability.  I decided that I would  instead use a laparoscopic absorbable tacker for that edge.  All needles and the positioning system were then removed through the 12 mm port without complications.  The DaVinci platform was then undocked and instruments removed.    An additional 5 mm port was created in the right abdominal wall and the tacker was placed through and  tacked the left lateral edge of the mesh.  Then, 80 ml of Exparel solution mixed with 0.5% bupivacaine with epi was infiltrated around the mesh edges, hernia repair site, and port sites.  The 12 mm port was removed and the fascia was closed under direct visualization utilizing an Endo Close technique with 0 Vicryl suture.  The 8 mm and 5 mm ports were removed. The 12 mm incision was closed using 3-0 Vicryl and 4-0 Monocryl, and the other port incisions were closed with 4-0 Monocryl.  The wounds were cleaned and sealed with DermaBond.  The patient was emerged from anesthesia and extubated and brought to the recovery room for further management.  The patient tolerated the procedure well and all counts were correct at the end of the case.   Melvyn Neth, MD

## 2021-07-21 ENCOUNTER — Encounter: Payer: Self-pay | Admitting: Surgery

## 2021-07-23 NOTE — Anesthesia Postprocedure Evaluation (Signed)
Anesthesia Post Note  Patient: Samantha Francis [place]  Procedure(s) Performed: XI ROBOTIC ASSISTED VENTRAL HERNIA WITH MESH (Abdomen)  Patient location during evaluation: PACU Anesthesia Type: General Level of consciousness: awake and alert Pain management: pain level controlled Vital Signs Assessment: post-procedure vital signs reviewed and stable Respiratory status: spontaneous breathing, nonlabored ventilation and respiratory function stable Cardiovascular status: blood pressure returned to baseline and stable Postop Assessment: no apparent nausea or vomiting Anesthetic complications: no   No notable events documented.   Last Vitals:  Vitals:   07/20/21 1915 07/20/21 1933  BP: 130/77 134/77  Pulse: (!) 109 98  Resp: 19 15  Temp: 36.7 C (!) 36.4 C  SpO2: 99% 99%    Last Pain:  Vitals:   07/20/21 1933  TempSrc: Temporal  PainSc: 0-No pain                 [place] Ouch

## 2021-07-26 ENCOUNTER — Telehealth: Payer: Self-pay | Admitting: *Deleted

## 2021-07-26 NOTE — Telephone Encounter (Signed)
Patient notified of return to work note faxed to [employer].

## 2021-07-26 NOTE — Telephone Encounter (Signed)
Patient wants a return work note uploaded in her [facility] or fax it to [phone] to return to work today.

## 2021-08-11 ENCOUNTER — Ambulatory Visit (INDEPENDENT_AMBULATORY_CARE_PROVIDER_SITE_OTHER): Payer: 59 | Admitting: Surgery

## 2021-08-11 ENCOUNTER — Other Ambulatory Visit: Payer: Self-pay

## 2021-08-11 ENCOUNTER — Encounter: Payer: 59 | Admitting: Physician Assistant

## 2021-08-11 ENCOUNTER — Encounter: Payer: Self-pay | Admitting: Surgery

## 2021-08-11 VITALS — BP 133/87 | HR 96 | Temp 98.6°F | Ht 62.25 in | Wt 206.0 lb

## 2021-08-11 DIAGNOSIS — K439 Ventral hernia without obstruction or gangrene: Secondary | ICD-10-CM

## 2021-08-11 DIAGNOSIS — Z09 Encounter for follow-up examination after completed treatment for conditions other than malignant neoplasm: Secondary | ICD-10-CM

## 2021-08-11 NOTE — Patient Instructions (Addendum)
Follow-up with our office as needed.  Please call and ask to speak with a nurse if you develop questions or concerns.   GENERAL POST-OPERATIVE PATIENT INSTRUCTIONS   WOUND CARE INSTRUCTIONS:  Try to keep the wound dry and avoid ointments on the wound unless directed to do so.  If the wound becomes bright red and painful or starts to drain infected material that is not clear, please contact your physician immediately.  If the wound is mildly pink and has a thick firm ridge underneath it, this is normal, and is referred to as a healing ridge.  This will resolve over the next 4-6 weeks.  BATHING: You may shower if you have been informed of this by your surgeon. However, Please do not submerge in a tub, hot tub, or pool until incisions are completely sealed or have been told by your surgeon that you may do so.  DIET:  You may eat any foods that you can tolerate.  It is a good idea to eat a high fiber diet and take in plenty of fluids to prevent constipation.  If you do become constipated you may want to take a mild laxative or take ducolax tablets on a daily basis until your bowel habits are regular.  Constipation can be very uncomfortable, along with straining, after recent surgery.  ACTIVITY:  You are encouraged to walk and engage in light activity for the next two weeks.  You should not lift more than 20 pounds for 4-6 weeks after surgery as it could put you at increased risk for complications.  Twenty pounds is roughly equivalent to a plastic bag of groceries. At that time- Listen to your body when lifting, if you have pain when lifting, stop and then try again in a few days. Soreness after doing exercises or activities of daily living is normal as you get back in to your normal routine.  MEDICATIONS:  Try to take narcotic medications and anti-inflammatory medications, such as tylenol, ibuprofen, naprosyn, etc., with food.  This will minimize stomach upset from the medication.  Should you develop  nausea and vomiting from the pain medication, or develop a rash, please discontinue the medication and contact your physician.  You should not drive, make important decisions, or operate machinery when taking narcotic pain medication.  SUNBLOCK Use sun block to incision area over the next year if this area will be exposed to sun. This helps decrease scarring and will allow you avoid a permanent darkened area over your incision.  QUESTIONS:  Please feel free to call our office if you have any questions, and we will be glad to assist you.

## 2021-08-11 NOTE — Progress Notes (Signed)
08/11/2021  HPI: Samantha Francis is a 40 y.o. female s/p robotic assisted ventral hernia repair on 07/20/2021.  Patient had 4 defects and diastases recti which were all repaired and mesh applied.  She did require an additional 5 mm port in the right midabdomen to help with tacking the left side of the mesh using absorbable tacks.  Today she reports that she has been doing well without any significant pain or discomfort.  She reports that she does feel a small bump in the mid abdomen but denies any significant pain with it.  Denies any issues with the incisions.  Vital signs: BP 133/87    Pulse 96    Temp 98.6 F (37 C)    Ht 5' 2.25" (1.581 m)    Wt 206 lb (93.4 kg)    LMP 07/19/2021 (Approximate)    SpO2 100%    BMI 37.38 kg/m    Physical Exam: Constitutional: No acute distress Abdomen: Soft, nondistended, nontender to palpation.  Incisions are healing well and are clean, dry, intact without any evidence of infection.  There is no evidence of any hernia recurrence.  There is a small mass that is noted in the lower epigastric area to the left of the umbilicus which is consistent with a seroma formation and part of the remnant hernia sac from her surgery.  It is soft with no tenderness.  Assessment/Plan: This is a 41 y.o. female s/p robotic assisted ventral hernia repair.  - Discussed with the patient that the bump that she is feeling is a residual seroma from the hernia sac that she had from her surgery.  This should continue to improve with time but it does take some time for the fluid to reabsorb.  At this point it does not appear to be infected as she is nontender and no interventions are needed. - Discussed with her again the need for that additional 5 mm port in the right abdomen to tack the mesh on the left side and explained the reason behind this. - Follow-up as needed.   Melvyn Neth, [facility]

## 2021-12-02 ENCOUNTER — Other Ambulatory Visit (HOSPITAL_COMMUNITY)
Admission: RE | Admit: 2021-12-02 | Discharge: 2021-12-02 | Disposition: A | Discharge status: Home / Self Care | Payer: 59 | Source: Ambulatory Visit | Attending: Family Medicine | Admitting: Family Medicine

## 2021-12-02 ENCOUNTER — Ambulatory Visit (INDEPENDENT_AMBULATORY_CARE_PROVIDER_SITE_OTHER): Payer: 59

## 2021-12-02 VITALS — BP 144/82 | HR 79 | Ht 62.5 in | Wt 201.6 lb

## 2021-12-02 DIAGNOSIS — N898 Other specified noninflammatory disorders of vagina: Secondary | ICD-10-CM | POA: Insufficient documentation

## 2021-12-02 NOTE — Progress Notes (Signed)
Patient here today for STD testing due to possible exposure of STD. Pt states having vaginal discharge with odor and irritation x 2 weeks. Has not used any OTC medications for any treatment. Pt had trich + in Oct 2022.  ?Self swab collected today. Pt advised results will take 24-48 hours and will see results in mychart and will be notified if needs further treatment. Pt verbalized understanding. Pt also aware of holiday weekend and will be contacted through [facility] for any treatment needed. Pt agreeable to plan of care.  ?Colletta Maryland, RN  ?

## 2021-12-03 LAB — CERVICOVAGINAL ANCILLARY ONLY
Bacterial Vaginitis (gardnerella): NEGATIVE
Candida Glabrata: NEGATIVE
Candida Vaginitis: NEGATIVE
Chlamydia: NEGATIVE
Comment: NEGATIVE
Comment: NEGATIVE
Comment: NEGATIVE
Comment: NEGATIVE
Comment: NEGATIVE
Comment: NORMAL
Neisseria Gonorrhea: NEGATIVE
Trichomonas: NEGATIVE

## 2021-12-13 ENCOUNTER — Encounter: Payer: Self-pay | Admitting: *Deleted

## 2022-02-13 IMAGING — CT CT ABDOMEN W/O CM
1 of 2 series · 13 of 32 positions shown, 19 images · non-contrast
Comparison: None

CLINICAL DATA: Evaluation of ventral hernia.

EXAM:
CT ABDOMEN WITHOUT CONTRAST
TECHNIQUE: Multidetector CT imaging of the abdomen was performed following the
standard protocol without IV contrast.

[Series 2: abd w/(date) · axial · 0.86mm/px · z∈[-274,-39]mm · 13 of 55 slices shown, 19 images]
[im 4/55  soft-tissue]
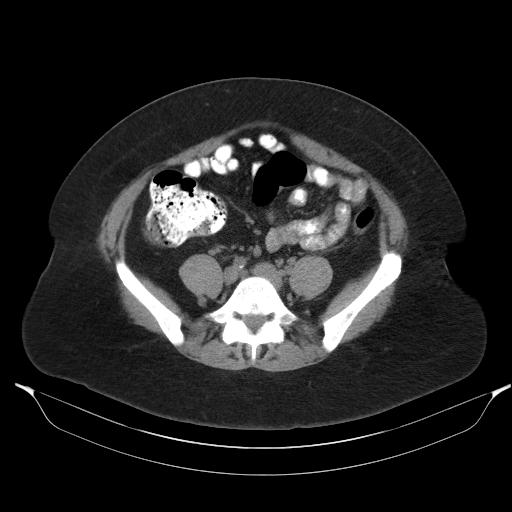
[im 4/55  bone]
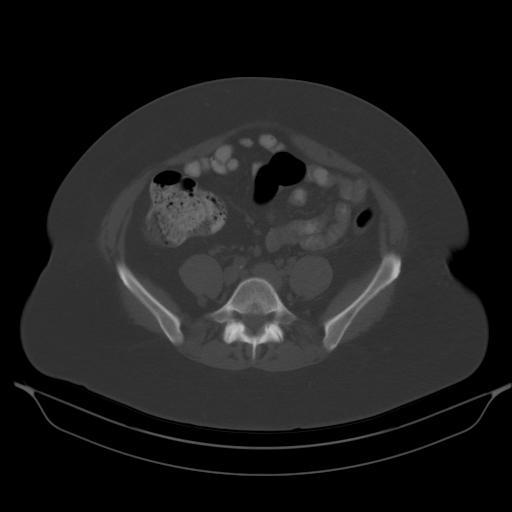
[im 8/55  soft-tissue]
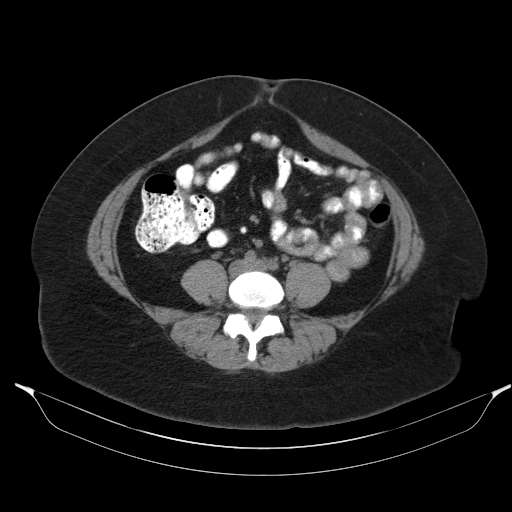
[im 12/55  soft-tissue]
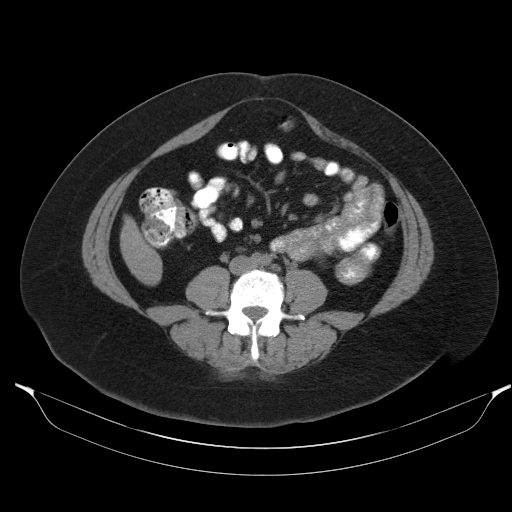
[im 16/55  soft-tissue]
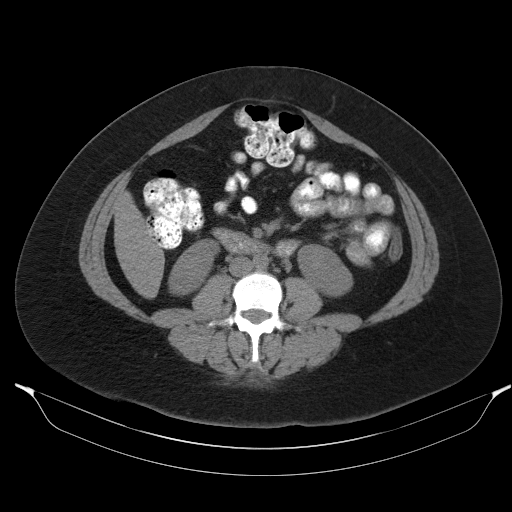
[im 20/55  soft-tissue]
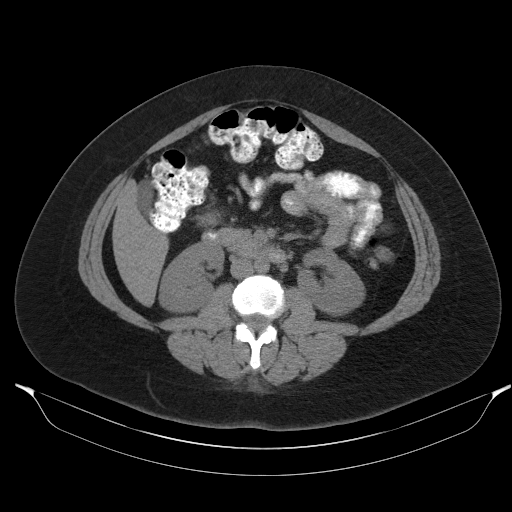
[im 24/55  soft-tissue]
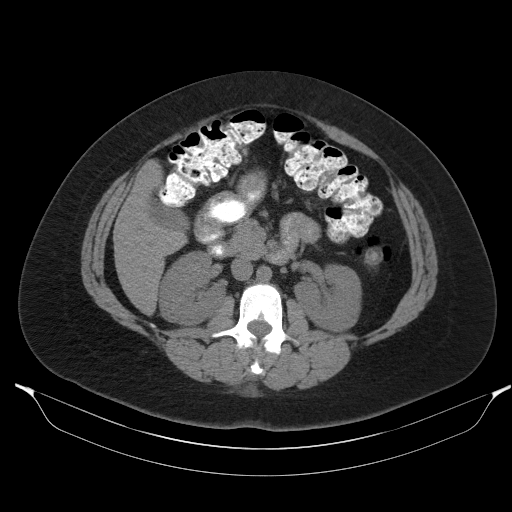
[im 28/55  soft-tissue]
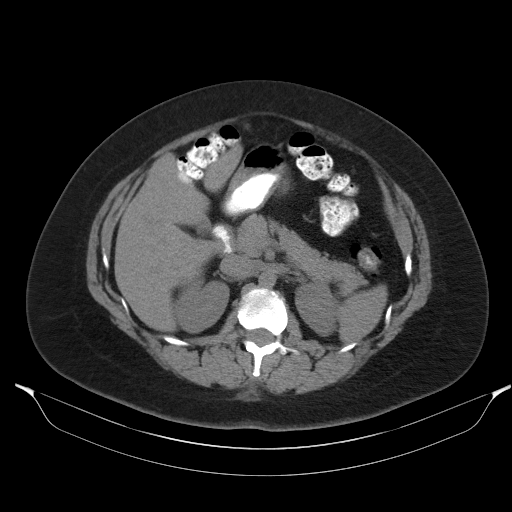
[im 31/55  soft-tissue]
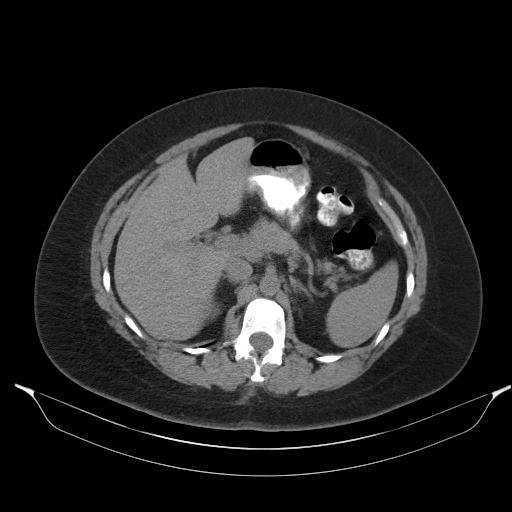
[im 35/55  soft-tissue]
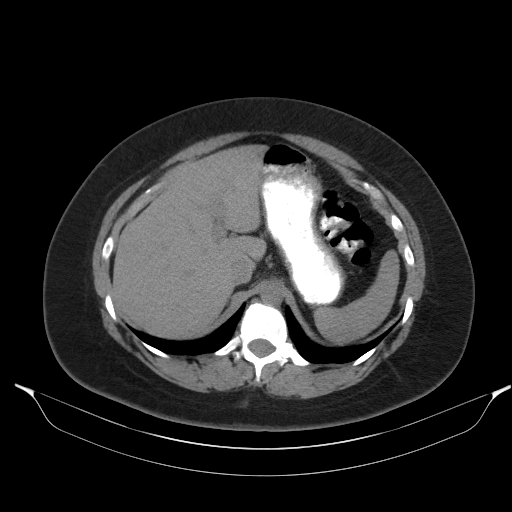
[im 35/55  bone]
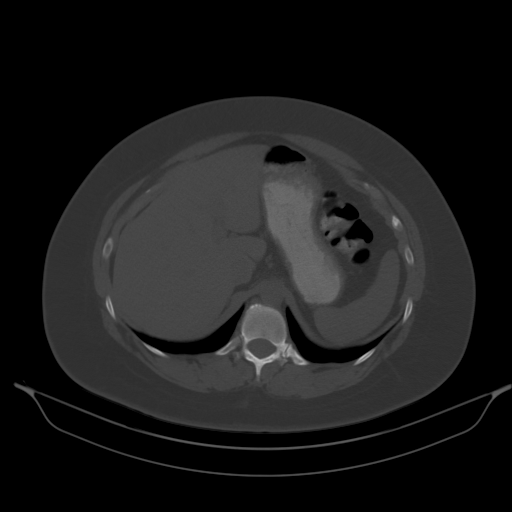
[im 39/55  soft-tissue]
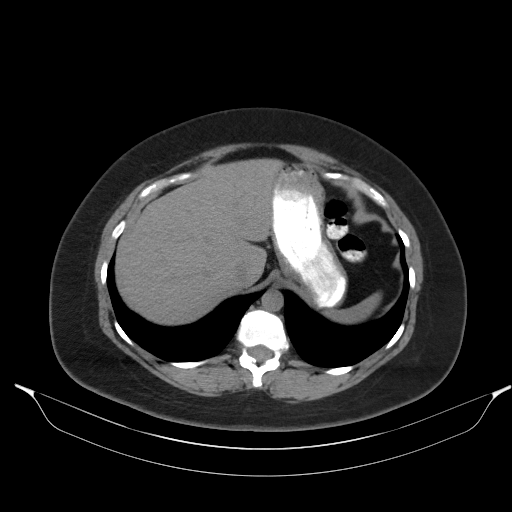
[im 39/55  lung]
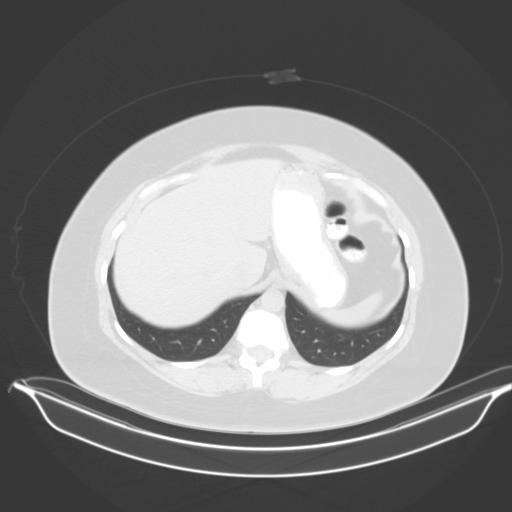
[im 43/55  soft-tissue]
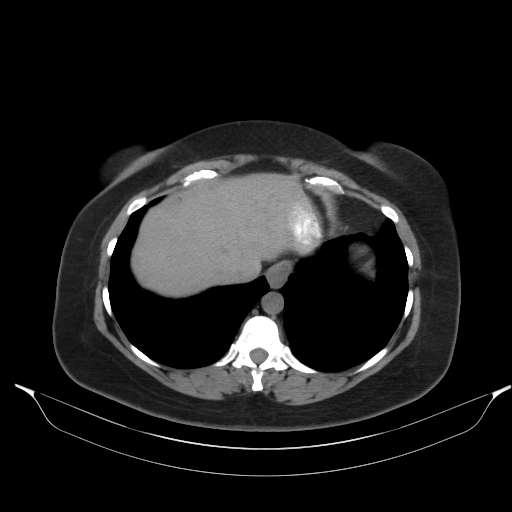
[im 43/55  lung]
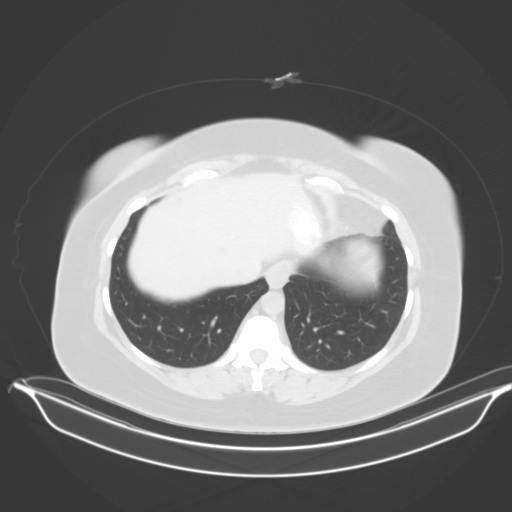
[im 47/55  soft-tissue]
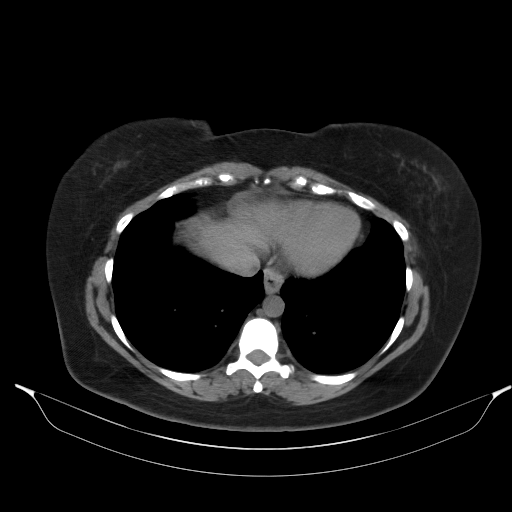
[im 47/55  lung]
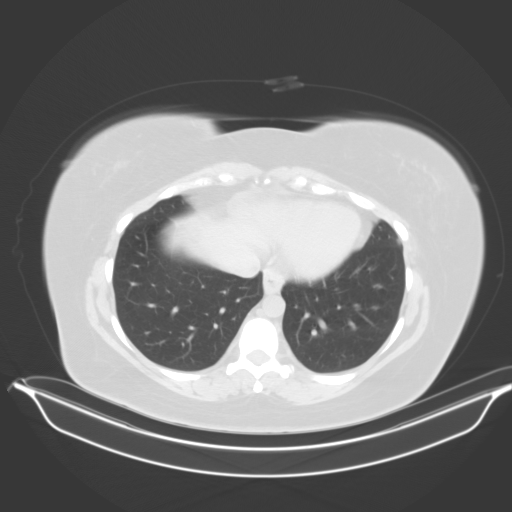
[im 51/55  soft-tissue]
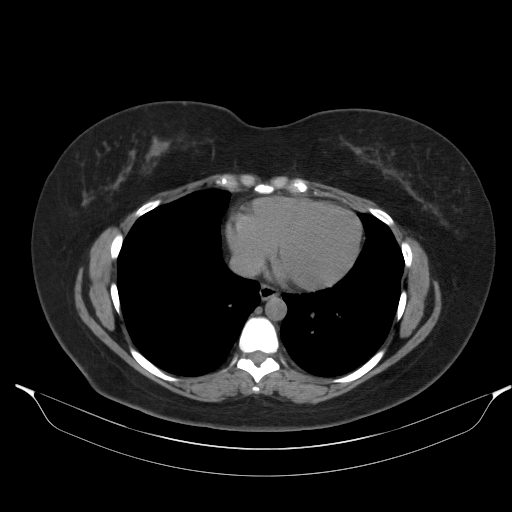
[im 51/55  lung]
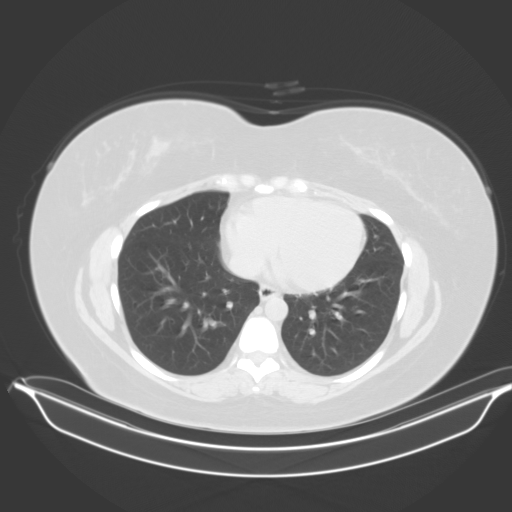

[13 of 32 positions shown; findings below may reference images not displayed]

FINDINGS: Lower chest: No acute abnormality.

Hepatobiliary: Unremarkable noncontrast appearance of the hepatic
parenchyma. Gallbladder is unremarkable. No biliary ductal dilation.

Pancreas: Within normal limits.

Spleen: Within normal limits.

Adrenals/Urinary Tract: Bilateral adrenal glands within normal
limits. No hydronephrosis. No renal stones.

Stomach/Bowel: Radiopaque enteric contrast traverses the splenic
flexure. Stomach is unremarkable for degree of distension. No
pathologic dilation or evidence of acute inflammation involving the
loops of small or large bowel in the abdomen.

Vascular/Lymphatic: No abdominal aortic aneurysm. No pathologically
enlarged abdominal lymph nodes.

Other: Diastasis rectus with a tiny periumbilical fat containing
ventral hernia.

Musculoskeletal: No acute or significant osseous findings.
IMPRESSION: Diastasis rectus with a tiny periumbilical fat containing ventral
hernia.

## 2022-06-01 ENCOUNTER — Telehealth: Payer: Self-pay | Admitting: Family Medicine

## 2022-06-01 NOTE — Telephone Encounter (Signed)
Patient is requesting a RX for Megestrol   [facility] on [address] ,  patient has been put on our list to call for a appointment for December

## 2022-06-01 NOTE — Telephone Encounter (Signed)
I called pt and discussed her concern. She stated that she had been taking Norethindrone as prescribed but it was very costly and she is no longer able to afford it.  She had some refills left of Megestrol from previous prescription so has been taking that. She is out of refills on Megestrol and requests new prescription. She reports that the original prescribed dose of 40 mg twice daily caused her to have no periods so therefore she has been taking 40 mg once daily.  I advised that I will send message to Dr. Kennon Rounds and request refill Rx. She voiced understanding.

## 2022-06-02 ENCOUNTER — Other Ambulatory Visit: Payer: Self-pay | Admitting: Family Medicine

## 2022-06-02 MED ORDER — MEGESTROL ACETATE 40 MG PO TABS: 40.0000 mg | ORAL_TABLET | Freq: Every day | ORAL | 3 refills | Status: DC

## 2022-07-13 ENCOUNTER — Ambulatory Visit (INDEPENDENT_AMBULATORY_CARE_PROVIDER_SITE_OTHER): Payer: 59 | Admitting: Obstetrics and Gynecology

## 2022-07-13 ENCOUNTER — Encounter: Payer: Self-pay | Admitting: Obstetrics and Gynecology

## 2022-07-13 ENCOUNTER — Other Ambulatory Visit (HOSPITAL_COMMUNITY)
Admission: RE | Admit: 2022-07-13 | Discharge: 2022-07-13 | Disposition: A | Discharge status: Home / Self Care | Payer: 59 | Source: Ambulatory Visit | Attending: Obstetrics and Gynecology | Admitting: Obstetrics and Gynecology

## 2022-07-13 VITALS — BP 152/89 | HR 87 | Ht 62.0 in | Wt 198.9 lb

## 2022-07-13 DIAGNOSIS — Z113 Encounter for screening for infections with a predominantly sexual mode of transmission: Secondary | ICD-10-CM | POA: Diagnosis present

## 2022-07-13 DIAGNOSIS — N939 Abnormal uterine and vaginal bleeding, unspecified: Secondary | ICD-10-CM

## 2022-07-13 DIAGNOSIS — Z23 Encounter for immunization: Secondary | ICD-10-CM | POA: Diagnosis not present

## 2022-07-13 MED ORDER — MEGESTROL ACETATE 40 MG PO TABS: 80.0000 mg | ORAL_TABLET | Freq: Every day | ORAL | 3 refills | Status: DC

## 2022-07-13 NOTE — Progress Notes (Signed)
ANNUAL EXAM Patient name: Samantha Francis MRN [id number]  Date of birth: [birth year] Chief Complaint:   Gynecologic Exam  History of Present Illness:   Samantha Francis is a 41 y.o. [phone] female being seen today for a routine annual exam. She has been on megace then aygestin would have "normal" cycles when on either aygestin or megace - bleeding for 3-5 days, but if not taking will bleed throughout the month. Did not like aygestin - didn't feel it helped as much and just took it because she was supposed to, noted that this cost more than the original medication. She had an IUD inserted in 2017 and feels it is due for removal. She believes the IUD is still in place - she felt strings soon after insertion but has not felt them more recently. She  She has completed childbearing.  She is worried about the use of hormones due to her mother, sister and cousin having breast cancer. As far as she is aware it is BRCA negative but she is going to check her mother's paperwork. Mother also has severe Alzeheimer's.   No LMP recorded.   The pregnancy intention screening data noted above was reviewed. Potential methods of contraception were discussed. The patient elected to proceed with No data recorded.   Last pap 05/2021. Results were: NILM w/ HRHPV negative.  Last mammogram: 05/2021. Results were: normal. Family h/o breast cancer: yes mother and sister has concerning  . Maternal cousin with breast CA - told not likely genetic but may be due to hormones Last colonoscopy: n/a     07/13/2022   11:15 AM 06/02/2021    5:15 PM 08/15/2018    8:41 AM 07/13/2018    9:44 AM 05/17/2018    9:02 AM  Depression screen PHQ 2/9  Decreased Interest 0 0 0 0 0  Down, Depressed, Hopeless 0 0 0 0 0  PHQ - 2 Score 0 0 0 0 0  Altered sleeping 3 1 0 2 1  Tired, decreased energy _0 Change in appetite 0 0 _1 Feeling bad or failure about yourself  0 0 1 0 0  Trouble concentrating 0 0 0 0 0   Moving slowly or fidgety/restless 0 0 0 0 0  Suicidal thoughts 0 0 0 0 0  PHQ-9 Score _2 07/13/2022   11:15 AM 06/02/2021    5:15 PM 08/15/2018    8:41 AM 07/13/2018    9:44 AM  GAD 7 : Generalized Anxiety Score  Nervous, Anxious, on Edge 0 0 0 0  Control/stop worrying 0 0 0 0  Worry too much - different things 0 0 0 0  Trouble relaxing 0 0 0 0  Restless 0 0 0 0  Easily annoyed or irritable 0 0 0 0  Afraid - awful might happen 0 0 0 0  Total GAD 7 Score 0 0 0 0     Review of Systems:   Pertinent items are noted in HPI Denies any headaches, blurred vision, fatigue, shortness of breath, chest pain, abdominal pain, abnormal vaginal discharge/itching/odor/irritation, problems with periods, bowel movements, urination, or intercourse unless otherwise stated above. Pertinent History Reviewed:  Reviewed past medical,surgical, social and family history.  Reviewed problem list, medications and allergies. Physical Assessment:   Vitals:   07/13/22 1053  BP: (!) 152/89  Pulse: 87  Weight: 198 lb 14.4 oz (90.2 kg)  Height: _0  (1.575 m)  Body mass index is 36.38 kg/m.        Physical Examination:   General appearance - well appearing, and in no distress  Mental status - alert, oriented to person, place, and time  Psych:  She has a normal mood and affect  Skin - warm and dry, normal color, no suspicious lesions noted  Chest - effort normal, all lung fields clear to auscultation bilaterally  Heart - normal rate and regular rhythm  Abdomen - soft, nontender, nondistended, no masses or organomegaly  Pelvic - deferred  Chaperone present for exam  No results found for this or any previous visit (from the past 24 hour(s)).    Assessment & Plan:  1. Abnormal uterine bleeding (AUB) Discussed medical management and can safely use megace for management of bleeding - will increase to previously tolerated rate of 40 BID/80 daily. Ordered pelvic US to assess for interval  changes including presence or absence of IUD (not visualized by this provider on last [place]). Discussed options of insertion of new IUD, continued medication, possible ablation thought myometrial thickness should be assess on ultrasound given history of 4 cesarean sections and myomectomy, vs definite treatment with hysterectomy. Reviewed concern for intraabdominal adhesions due to aforementioned pelvic surgeries as well as recent ventral hernia repair with mesh. Will follow up after ultrasound to assess bleeding as well as for possible IUD removal if in place.  - US PELVIC COMPLETE WITH TRANSVAGINAL; Future - megestrol (MEGACE) 40 MG tablet; Take 2 tablets (80 mg total) by mouth daily.  Dispense: 30 tablet; Refill: 3  2. Routine screening for STI (sexually transmitted infection) Patient requested STI screening today. Also ordered CBC to assess for anemia due to description of prolonged bleeding beyond menses - Cervicovaginal ancillary only - Hepatitis B surface antigen - Hepatitis C antibody - HIV Antibody (routine testing w rflx) - RPR - CBC - Prolactin - TSH  3. Need for immunization against influenza - Flu Vaccine QUAD 27moIM (Fluarix, Fluzone & Alfiuria Quad PF)   Orders Placed This Encounter  Procedures   [place]PELVIC COMPLETE WITH TRANSVAGINAL   Flu Vaccine QUAD 665moM (Fluarix, Fluzone & Alfiuria Quad PF)   Hepatitis B surface antigen   Hepatitis C antibody   HIV Antibody (routine testing w rflx)   RPR   CBC   Prolactin   TSH    Meds:  Meds ordered this encounter  Medications   megestrol (MEGACE) 40 MG tablet    Sig: Take 2 tablets (80 mg total) by mouth daily.    Dispense:  30 tablet    Refill:  3    Follow-up: Return for GYN Follow Up.  ChDarliss CheneyMD 07/13/2022 2:18 PM

## 2022-07-13 NOTE — Patient Instructions (Addendum)
Ultrasound ordered to check for IUD Follow up visit to check on bleeding on increased dose of megace and possible IUD removal Checking your blood count today  STI checking today as well

## 2022-07-14 LAB — CBC
Hematocrit: 39.1 % (ref 34.0–46.6)
Hemoglobin: 13.1 g/dL (ref 11.1–15.9)
MCH: 31.3 pg (ref 26.6–33.0)
MCHC: 33.5 g/dL (ref 31.5–35.7)
MCV: 94 fL (ref 79–97)
Platelets: 203 10*3/uL (ref 150–450)
RBC: 4.18 x10E6/uL (ref 3.77–5.28)
RDW: 11.9 % (ref 11.7–15.4)
WBC: 4.9 10*3/uL (ref 3.4–10.8)

## 2022-07-14 LAB — HEPATITIS C ANTIBODY: Hep C Virus Ab: NONREACTIVE

## 2022-07-14 LAB — HIV ANTIBODY (ROUTINE TESTING W REFLEX): HIV Screen 4th Generation wRfx: NONREACTIVE

## 2022-07-14 LAB — RPR: RPR Ser Ql: NONREACTIVE

## 2022-07-14 LAB — CERVICOVAGINAL ANCILLARY ONLY
Chlamydia: NEGATIVE
Comment: NEGATIVE
Comment: NEGATIVE
Comment: NORMAL
Neisseria Gonorrhea: NEGATIVE
Trichomonas: NEGATIVE

## 2022-07-14 LAB — HEPATITIS B SURFACE ANTIGEN: Hepatitis B Surface Ag: NEGATIVE

## 2022-07-14 LAB — TSH: TSH: 2.89 u[IU]/mL (ref 0.450–4.500)

## 2022-07-14 LAB — PROLACTIN: Prolactin: 14.1 ng/mL (ref 4.8–23.3)

## 2022-07-25 ENCOUNTER — Encounter: Payer: Self-pay | Admitting: Obstetrics and Gynecology

## 2022-07-26 ENCOUNTER — Telehealth: Payer: Self-pay | Admitting: Family Medicine

## 2022-07-26 NOTE — Telephone Encounter (Signed)
Patient stated she does not want to wait until the next available appointment which is 1/10. She said she would be willing to go to a different location if needed or if something else can be done about her bleeding everyday.

## 2022-07-28 ENCOUNTER — Telehealth: Payer: Self-pay | Admitting: Family Medicine

## 2022-07-28 NOTE — Telephone Encounter (Signed)
Called pt and informed her of f/u appt w/Dr. Currie Paris has been scheduled @ [facility] office on 12/8 @ 9:55 am. Per Dr. Currie Paris, she will need to have ultrasound prior to that appt.  I have scheduled this on 12/6 @ 11:00. She will need to have a full bladder for the exam. Pt voiced understanding and agreed to plan of care.

## 2022-07-28 NOTE — Telephone Encounter (Signed)
Called patient to relay this message from Dr. Currie Paris "Other locations are [facility] or [place] if there is sooner availability. Additionally, can increase megace to twice daily to help with bleeding. Also still needs to complete interval ultrasound and EMB at follow up.". Patient had earlier requested a sooner appointment and options to help with her bleeding. There was no answer to the phone call so a voicemail was left with the call back number for the office.

## 2022-08-03 ENCOUNTER — Ambulatory Visit
Admission: RE | Admit: 2022-08-03 | Discharge: 2022-08-03 | Disposition: A | Discharge status: Home / Self Care | Payer: 59 | Source: Ambulatory Visit | Attending: Obstetrics and Gynecology | Admitting: Obstetrics and Gynecology

## 2022-08-03 DIAGNOSIS — N939 Abnormal uterine and vaginal bleeding, unspecified: Secondary | ICD-10-CM | POA: Diagnosis present

## 2022-08-05 ENCOUNTER — Other Ambulatory Visit (HOSPITAL_COMMUNITY)
Admission: RE | Admit: 2022-08-05 | Discharge: 2022-08-05 | Disposition: A | Discharge status: Home / Self Care | Payer: 59 | Source: Ambulatory Visit | Attending: Obstetrics and Gynecology | Admitting: Obstetrics and Gynecology

## 2022-08-05 ENCOUNTER — Ambulatory Visit (INDEPENDENT_AMBULATORY_CARE_PROVIDER_SITE_OTHER): Payer: 59 | Admitting: Obstetrics and Gynecology

## 2022-08-05 ENCOUNTER — Encounter: Payer: Self-pay | Admitting: Obstetrics and Gynecology

## 2022-08-05 VITALS — BP 131/89 | HR 82 | Wt 200.0 lb

## 2022-08-05 DIAGNOSIS — N939 Abnormal uterine and vaginal bleeding, unspecified: Secondary | ICD-10-CM

## 2022-08-05 NOTE — Progress Notes (Unsigned)
GYNECOLOGY VISIT  Patient name: Samantha Francis MRN [id number]  Date of birth: 20-Jun-1981 Chief Complaint:   Consult   History:  Samantha Francis is a 41 y.o. [phone] being seen today for AUB. Continued to have heavy cycles though not as heavy with megace. Interested in definitive management.    Past Medical History:  Diagnosis Date   Allergic rhinitis    Allergy    Anemia    not with current pregnancy   Asthma    childhood but never used an inhaler   GERD (gastroesophageal reflux disease)    with pregnancy   History of uterine fibroid    HSV (herpes simplex virus) anogenital infection    PONV (postoperative nausea and vomiting)    Spinal headache     Past Surgical History:  Procedure Laterality Date   CESAREAN SECTION  2009   CESAREAN SECTION N/A 06/10/2014   Procedure: CESAREAN SECTION;  Surgeon: Allyn Kenner, DO;  Location: [facility];  Service: Obstetrics;  Laterality: N/A;   CESAREAN SECTION N/A 04/20/2016   Procedure: CESAREAN SECTION;  Surgeon: Donnamae Jude, MD;  Location: [place];  Service: Obstetrics;  Laterality: N/A;   MYOMECTOMY     TUBAL LIGATION Bilateral 04/20/2016   Procedure: BILATERAL TUBAL LIGATION;  Surgeon: Donnamae Jude, MD;  Location: [place];  Service: Obstetrics;  Laterality: Bilateral;   XI ROBOTIC ASSISTED VENTRAL HERNIA N/A 07/20/2021   Procedure: XI ROBOTIC ASSISTED VENTRAL HERNIA WITH MESH;  Surgeon: Olean Ree, MD;  Location: [facility];  Service: General;  Laterality: N/A;    The following portions of the patient's history were reviewed and updated as appropriate: allergies, current medications, past family history, past medical history, past social history, past surgical history and problem list.   Health Maintenance:   Last pap 05/2021. Results were: NILM w/ HRHPV negative.  Last mammogram: 05/2021. Results were: normal.    Review of Systems:  Pertinent items are noted in HPI. Comprehensive  review of systems was otherwise negative.   Objective:  Physical Exam BP 131/89   Pulse 82   Wt 200 lb (90.7 kg)   LMP 07/11/2022 (Exact Date)   BMI 36.58 kg/m    Physical Exam Vitals and nursing note reviewed. Exam conducted with a chaperone present.  Constitutional:      Appearance: Normal appearance.  HENT:     Head: Normocephalic and atraumatic.  Cardiovascular:     Rate and Rhythm: Normal rate and regular rhythm.  Pulmonary:     Effort: Pulmonary effort is normal.     Breath sounds: Normal breath sounds.  Genitourinary:    General: Normal vulva.     Exam position: Lithotomy position.     Vagina: Normal.     Cervix: Normal.     Comments: IUD strings visualized Skin:    General: Skin is warm and dry.  Neurological:     General: No focal deficit present.     Mental Status: She is alert.  Psychiatric:        Mood and Affect: Mood normal.        Behavior: Behavior normal.        Thought Content: Thought content normal.        Judgment: Judgment normal.    Labs and Imaging US PELVIC COMPLETE WITH TRANSVAGINAL  Result Date: 08/03/2022 CLINICAL DATA:  Abnormal heavy uterine bleeding, continuous bleeding, assess IUD position, LMP uncertain questionably 07/05/2022 EXAM: TRANSABDOMINAL AND TRANSVAGINAL ULTRASOUND OF PELVIS TECHNIQUE:  Both transabdominal and transvaginal ultrasound examinations of the pelvis were performed. Transabdominal technique was performed for global imaging of the pelvis including uterus, ovaries, adnexal regions, and pelvic cul-de-sac. It was necessary to proceed with endovaginal exam following the transabdominal exam to visualize the ovaries. COMPARISON:  07/20/2018 FINDINGS: Uterus Measurements: 9.1 x 5.2 x 6.7 cm = volume: 165 mL. Anteverted. Normal morphology without mass. Anterior wall Caesarean section scar. Endometrium Thickness: 3 mm. IUD in expected position at upper uterine segment endometrial canal. Fluid within endometrial canal extending into  the Caesarean section scar. No focal mass. Right ovary Measurements: 1.7 x 1.1 x 2.3 cm = volume: 2.2 mL. Normal morphology without mass Left ovary Measurements: 3.0 x 1.9 x 2.2 cm = volume: 6.3 mL. Normal morphology without mass Other findings No free pelvic fluid or adnexal masses. IMPRESSION: IUD in expected position at upper uterine segment endometrial canal. Fluid within endometrial canal extending into Caesarean section scar. Otherwise negative exam. Electronically Signed   By: Lavonia Dana M.D.   On: 08/03/2022 14:44        Endometrial Biopsy Procedure  Patient identified, informed consent performed,  indication reviewed, consent signed.  Reviewed risk of perforation, pain, bleeding, insufficient sample, etc were reviewd. Time out was performed.  Urine pregnancy test negative.  Speculum placed in the vagina.  Cervix visualized.  Cleaned with Betadine x 2.  Anterior cervix infiltrated with 0.5cc of 1% lidocaine..  Paracervical block was administered and the endocervical canal instilled with using 1% lidocaine.  Endometrial pipelle was used to draw up 1cc of 1% lidocaine, introduced into the cervical os and instilled into the endometrial cavity.  The pipelle was passed twice without difficulty and sample obtained.   Patient tolerated procedure well.  Patient was given post-procedure instructions.   Assessment & Plan:   Abnormal uterine bleeding (AUB) - Plan: Surgical pathology S/p uncomplicated EMB. Reviewed surgical hx - Cdx3, myomectomy, hernia repair. Endometrial ablation contraindicated due to surgical hx and thin cesarean scar and has failed medical management of bleeding.   Patient desires surgical management with RA-TLH, BS, Cysto.  The risks of surgery were discussed in detail with the patient including but not limited to: bleeding which may require transfusion or reoperation; infection which may require prolonged hospitalization or re-hospitalization and antibiotic therapy; injury to  bowel, bladder, ureters and major vessels or other surrounding organs which may lead to other procedures; formation of adhesions; need for additional procedures including laparotomy or subsequent procedures secondary to intraoperative injury or abnormal pathology; thromboembolic phenomenon; incisional problems and other postoperative or anesthesia complications.  Patient was told that the likelihood that her condition and symptoms will be treated effectively with this surgical management was very high; the postoperative expectations were also discussed in detail. The patient also understands the alternative treatment options which were discussed in full. All questions were answered.  She was told that she will be contacted by our surgical scheduler regarding the time and date of her surgery; routine preoperative instructions will be given to her by the preoperative nursing team.  3   Routine preventative health maintenance measures emphasized.  Darliss Cheney, MD [facility] [place]

## 2022-08-09 LAB — SURGICAL PATHOLOGY

## 2022-09-07 ENCOUNTER — Ambulatory Visit: Payer: 59 | Admitting: Obstetrics and Gynecology

## 2022-10-13 ENCOUNTER — Encounter: Payer: Self-pay | Admitting: Obstetrics and Gynecology

## 2022-10-27 NOTE — Pre-Procedure Instructions (Signed)
Surgical Instructions    Your procedure is scheduled on November 02, 2022.  Report to [facility] Main Entrance "A" at 6:30 A.M., then check in with the Admitting office.  Call this number if you have problems the morning of surgery:  [phone]  If you have any questions prior to your surgery date call [phone]: Open Monday-Friday 8am-4pm If you experience any cold or flu symptoms such as cough, fever, chills, shortness of breath, etc. between now and your scheduled surgery, please notify us at the above number.     Remember:  Do not eat after midnight the night before your surgery   You may drink clear liquids until 5:30 AM the morning of your surgery.   Clear liquids allowed are: Water, Non-Citrus Juices (without pulp), Carbonated Beverages, Clear Tea, Black Coffee Only (NO MILK, CREAM OR POWDERED CREAMER of any kind), and Gatorade.     Take these medicines the morning of surgery with A SIP OF WATER:  megestrol (MEGACE)    As of today, STOP taking any Aspirin (unless otherwise instructed by your surgeon) Aleve, Naproxen, Ibuprofen, Motrin, Advil, Goody's, BC's, all herbal medications, fish oil, and all vitamins.                     Do NOT Smoke (Tobacco/Vaping) for 24 hours prior to your procedure.  If you use a CPAP at night, you may bring your mask/headgear for your overnight stay.   Contacts, glasses, piercing's, hearing aid's, dentures or partials may not be worn into surgery, please bring cases for these belongings.    For patients admitted to the hospital, discharge time will be determined by your treatment team.   Patients discharged the day of surgery will not be allowed to drive home, and someone needs to stay with them for 24 hours.  SURGICAL WAITING ROOM VISITATION Patients having surgery or a procedure may have no more than 2 support people in the waiting area - these visitors may rotate.   Children under the age of 31 must have an adult with them who is not the  patient. If the patient needs to stay at the hospital during part of their recovery, the visitor guidelines for inpatient rooms apply. Pre-op nurse will coordinate an appropriate time for 1 support person to accompany patient in pre-op.  This support person may not rotate.   Please refer to the [facility] website for the visitor guidelines for Inpatients (after your surgery is over and you are in a regular room).    Special instructions:   [place]- Preparing For Surgery  Before surgery, you can play an important role. Because skin is not sterile, your skin needs to be as free of germs as possible. You can reduce the number of germs on your skin by washing with CHG (chlorahexidine gluconate) Soap before surgery.  CHG is an antiseptic cleaner which kills germs and bonds with the skin to continue killing germs even after washing.    Oral Hygiene is also important to reduce your risk of infection.  Remember - BRUSH YOUR TEETH THE MORNING OF SURGERY WITH YOUR REGULAR TOOTHPASTE  Please do not use if you have an allergy to CHG or antibacterial soaps. If your skin becomes reddened/irritated stop using the CHG.  Do not shave (including legs and underarms) for at least 48 hours prior to first CHG shower. It is OK to shave your face.  Please follow these instructions carefully.   Shower the [employer] SURGERY and  the MORNING OF SURGERY  If you chose to wash your hair, wash your hair first as usual with your normal shampoo.  After you shampoo, rinse your hair and body thoroughly to remove the shampoo.  Use CHG Soap as you would any other liquid soap. You can apply CHG directly to the skin and wash gently with a scrungie or a clean washcloth.   Apply the CHG Soap to your body ONLY FROM THE NECK DOWN.  Do not use on open wounds or open sores. Avoid contact with your eyes, ears, mouth and genitals (private parts). Wash Face and genitals (private parts)  with your normal soap.   Wash thoroughly,  paying special attention to the area where your surgery will be performed.  Thoroughly rinse your body with warm water from the neck down.  DO NOT shower/wash with your normal soap after using and rinsing off the CHG Soap.  Pat yourself dry with a CLEAN TOWEL.  Wear CLEAN PAJAMAS to bed the night before surgery  Place CLEAN SHEETS on your bed the night before your surgery  DO NOT SLEEP WITH PETS.   Day of Surgery: Take a shower with CHG soap. Do not wear jewelry or makeup Do not wear lotions, powders, perfumes/colognes, or deodorant. Do not shave 48 hours prior to surgery.  Men may shave face and neck. Do not bring valuables to the hospital.  [facility] is not responsible for any belongings or valuables. Do not wear nail polish, gel polish, artificial nails, or any other type of covering on natural nails (fingers and toes) If you have artificial nails or gel coating that need to be removed by a nail salon, please have this removed prior to surgery. Artificial nails or gel coating may interfere with anesthesia's ability to adequately monitor your vital signs.  Wear Clean/Comfortable clothing the morning of surgery Remember to brush your teeth WITH YOUR REGULAR TOOTHPASTE.   Please read over the following fact sheets that you were given.    If you received a COVID test during your pre-op visit  it is requested that you wear a mask when out in public, stay away from anyone that may not be feeling well and notify your surgeon if you develop symptoms. If you have been in contact with anyone that has tested positive in the last 10 days please notify you surgeon.

## 2022-10-28 ENCOUNTER — Encounter (HOSPITAL_COMMUNITY)
Admission: RE | Admit: 2022-10-28 | Discharge: 2022-10-28 | Disposition: A | Discharge status: Home / Self Care | Payer: 59 | Source: Ambulatory Visit | Attending: Obstetrics and Gynecology | Admitting: Obstetrics and Gynecology

## 2022-10-28 ENCOUNTER — Other Ambulatory Visit: Payer: Self-pay

## 2022-10-28 ENCOUNTER — Encounter (HOSPITAL_COMMUNITY): Payer: Self-pay

## 2022-10-28 VITALS — BP 131/80 | HR 81 | Temp 98.3°F | Resp 17 | Ht 62.75 in | Wt 201.9 lb

## 2022-10-28 DIAGNOSIS — Z79899 Other long term (current) drug therapy: Secondary | ICD-10-CM | POA: Insufficient documentation

## 2022-10-28 DIAGNOSIS — N939 Abnormal uterine and vaginal bleeding, unspecified: Secondary | ICD-10-CM | POA: Insufficient documentation

## 2022-10-28 DIAGNOSIS — Z9889 Other specified postprocedural states: Secondary | ICD-10-CM | POA: Insufficient documentation

## 2022-10-28 DIAGNOSIS — Z01818 Encounter for other preprocedural examination: Secondary | ICD-10-CM

## 2022-10-28 DIAGNOSIS — Z9851 Tubal ligation status: Secondary | ICD-10-CM | POA: Insufficient documentation

## 2022-10-28 DIAGNOSIS — N8003 Adenomyosis of the uterus: Secondary | ICD-10-CM | POA: Diagnosis not present

## 2022-10-28 DIAGNOSIS — Z01812 Encounter for preprocedural laboratory examination: Secondary | ICD-10-CM | POA: Diagnosis present

## 2022-10-28 LAB — CBC
HCT: 38.9 % (ref 36.0–46.0)
Hemoglobin: 12.9 g/dL (ref 12.0–15.0)
MCH: 31.8 pg (ref 26.0–34.0)
MCHC: 33.2 g/dL (ref 30.0–36.0)
MCV: 95.8 fL (ref 80.0–100.0)
Platelets: 200 10*3/uL (ref 150–400)
RBC: 4.06 MIL/uL (ref 3.87–5.11)
RDW: 11.9 % (ref 11.5–15.5)
WBC: 6.7 10*3/uL (ref 4.0–10.5)
nRBC: 0 % (ref 0.0–0.2)

## 2022-10-28 LAB — TYPE AND SCREEN
ABO/RH(D): O POS
Antibody Screen: NEGATIVE

## 2022-10-28 NOTE — Progress Notes (Signed)
PCP - Candida Peeling, PA-c Cardiologist - Denies  PPM/ICD - Denies Device Orders - n/a Rep Notified - n/a  Chest x-ray - n/a EKG - Denies Stress Test - Denies ECHO - Per pt, she had to complete an echo prior to being started on Wegovy within the past 1-3 years. Records Requested. Cardiac Cath - Denies  Sleep Study - Denies CPAP - n/a  No DM  Last dose of GLP1 agonist- n/a GLP1 instructions: n/a  Blood Thinner Instructions: n/a Aspirin Instructions: n/a  ERAS Protcol - Clear liquids until 0530 morning of surgery PRE-SURGERY Ensure or G2- n/a  COVID TEST- n/a   Anesthesia review: Yes. Echo result requested. Pt also mentioned that she had a rash on the lower abdomen from her umbilicus down to her upper thigh/groin region after her third C-Section. It was extremely itchy and was wondering if she needed any benadryl for this procedure. She did not develop this rash after her two previous C-Sections. RN instructed pt to tell her Anesthesiologist and Surgeon morning of surgery, as it could have been from the prep they used for the C-Sections. Pt understood instructions.   Patient denies shortness of breath, fever, cough and chest pain at PAT appointment. Pt denies any respiratory illness/infection in the past two months.    All instructions explained to the patient, with a verbal understanding of the material. Patient agrees to go over the instructions while at home for a better understanding. Patient also instructed to self quarantine after being tested for COVID-19. The opportunity to ask questions was provided.

## 2022-10-31 NOTE — Progress Notes (Signed)
error 

## 2022-10-31 NOTE — Progress Notes (Signed)
Anesthesia Chart Review:  Case: [id number] Date/Time: 11/02/22 0815   Procedures:      XI ROBOTIC ASSISTED TOTAL HYSTERECTOMY WITH SALPINGECTOMY (Bilateral) - wants to use the 30 degree robotic scope     CYSTOSCOPY   Anesthesia type: Choice   Pre-op diagnosis: AUB   Location: [facility] OR ROOM 10 / Post OR   Surgeons: Darliss Cheney, MD       DISCUSSION: Patient is a 42 year old female scheduled for the above procedure.   History includes never smoker, postoperative N/V, spinal headache (post-epidural 2009), childhood asthma, GERD, uterine fibroid, obesity, ventral hernia (s/p robotic assisted repair with mesh 07/20/21).   She reported an echo in 2022 prior to starting [facility]. Records requested from [facility]. (She is no longer on [facility] by medication list.) Office notes indicate echo was done on 07/06/21. Report requested.   UPDATE 11/01/22 2:52 PM: Records from [facility] received. Prior 2022 EKG tracing not included in records. Normal echo 07/06/21. Anesthesia team to evaluate on the day of surgery.    VS: BP 131/80   Pulse 81   Temp 36.8 C   Resp 17   Ht 5' 2.75" (1.594 m)   Wt 91.6 kg   LMP 10/17/2022   SpO2 100%   BMI 36.05 kg/m   PROVIDERS: Candida Peeling, PA-C is PCP [facility])   LABS: Labs reviewed: Acceptable for surgery. (all labs ordered are listed, but only abnormal results are displayed)  Labs Reviewed  CBC  TYPE AND SCREEN    She had spirometry on 06/01/22 which was interpreted as "normal". FVC 2.52 (89.2%). FEV1 2.01 (86.3%). FEV1/FVC 79.5% (95.8%).   EKG: N/A Per records received from [facility], EKG on 05/20/21 showed "Sinus Rhythm -consider old anterior infarct - Nonspecific T-abnormality" Copy of tracing requested.  CV: Echo 07/06/21 [facility]):  Conclusions: 1.  Normal study: Normal cardiac chamber sizes and function; normal valve anatomy and function; no pericardial effusion or intracardiac mass.  No  intracardiac shunts by 2D and color-flow imaging.  Normal thoracic aorta and aortic arch.  Past Medical History:  Diagnosis Date   Allergic rhinitis    Allergy    Anemia    not with current pregnancy   Asthma    childhood but never used an inhaler   GERD (gastroesophageal reflux disease)    with pregnancy   History of uterine fibroid    HSV (herpes simplex virus) anogenital infection    PONV (postoperative nausea and vomiting)    Spinal headache 2009   With delivery of daughter. Pt had epidural.    Past Surgical History:  Procedure Laterality Date   CESAREAN SECTION  2009   CESAREAN SECTION N/A 06/10/2014   Procedure: CESAREAN SECTION;  Surgeon: Allyn Kenner, DO;  Location: [place] [facility];  Service: Obstetrics;  Laterality: N/A;   CESAREAN SECTION N/A 04/20/2016   Procedure: CESAREAN SECTION;  Surgeon: Donnamae Jude, MD;  Location: [place];  Service: Obstetrics;  Laterality: N/A;   MYOMECTOMY     TUBAL LIGATION Bilateral 04/20/2016   Procedure: BILATERAL TUBAL LIGATION;  Surgeon: Donnamae Jude, MD;  Location: [place];  Service: Obstetrics;  Laterality: Bilateral;   XI ROBOTIC ASSISTED VENTRAL HERNIA N/A 07/20/2021   Procedure: XI ROBOTIC ASSISTED VENTRAL HERNIA WITH MESH;  Surgeon: Olean Ree, MD;  Location: [facility];  Service: General;  Laterality: N/A;    MEDICATIONS:  megestrol (MEGACE) 40 MG tablet   No current facility-administered medications for this encounter.  Myra Gianotti, PA-C Surgical Short Stay/Anesthesiology [facility] Phone [phone] [facility] Phone [phone] 10/31/2022 3:31 PM

## 2022-11-01 NOTE — Anesthesia Preprocedure Evaluation (Addendum)
Anesthesia Evaluation  Patient identified by MRN, date of birth, ID band Patient awake    Reviewed: Allergy & Precautions, NPO status , Patient's Chart, lab work & pertinent test results  History of Anesthesia Complications (+) PONV, POST - OP SPINAL HEADACHE and history of anesthetic complications  Airway Mallampati: III  TM Distance: >3 FB Neck ROM: Full    Dental  (+) Teeth Intact, Dental Advisory Given   Pulmonary asthma    Pulmonary exam normal breath sounds clear to auscultation       Cardiovascular negative cardio ROS Normal cardiovascular exam Rhythm:Regular Rate:Normal     Neuro/Psych  Headaches    GI/Hepatic Neg liver ROS,GERD  ,,  Endo/Other  Obesity   Renal/GU negative Renal ROS     Musculoskeletal  (+) Arthritis ,    Abdominal   Peds  Hematology negative hematology ROS (+)   Anesthesia Other Findings   Reproductive/Obstetrics AUB                             Anesthesia Physical Anesthesia Plan  ASA: 2  Anesthesia Plan: General   Post-op Pain Management: Tylenol PO (pre-op)*, Toradol IV (intra-op)* and Ketamine IV*   Induction: Intravenous  PONV Risk Score and Plan: 4 or greater and Scopolamine patch - Pre-op, Diphenhydramine, Midazolam, Dexamethasone and Ondansetron  Airway Management Planned: Oral ETT  Additional Equipment:   Intra-op Plan:   Post-operative Plan: Extubation in OR  Informed Consent: I have reviewed the patients History and Physical, chart, labs and discussed the procedure including the risks, benefits and alternatives for the proposed anesthesia with the patient or authorized representative who has indicated his/her understanding and acceptance.     Dental advisory given  Plan Discussed with: CRNA  Anesthesia Plan Comments: (PAT note written 10/31/2022 by Myra Gianotti, PA-C.  2nd PIV after induction)       Anesthesia Quick  Evaluation

## 2022-11-02 ENCOUNTER — Other Ambulatory Visit: Payer: Self-pay

## 2022-11-02 ENCOUNTER — Ambulatory Visit (HOSPITAL_BASED_OUTPATIENT_CLINIC_OR_DEPARTMENT_OTHER): Payer: 59 | Admitting: Certified Registered"

## 2022-11-02 ENCOUNTER — Other Ambulatory Visit: Payer: Self-pay | Admitting: Family Medicine

## 2022-11-02 ENCOUNTER — Encounter (HOSPITAL_COMMUNITY): Payer: Self-pay | Admitting: Obstetrics and Gynecology

## 2022-11-02 ENCOUNTER — Ambulatory Visit (HOSPITAL_COMMUNITY)
Admission: RE | Admit: 2022-11-02 | Discharge: 2022-11-03 | Disposition: A | Discharge status: Home / Self Care | Payer: 59 | Source: Ambulatory Visit | Attending: Obstetrics and Gynecology | Admitting: Obstetrics and Gynecology

## 2022-11-02 ENCOUNTER — Encounter (HOSPITAL_COMMUNITY)
Admission: RE | Disposition: A | Discharge status: Home / Self Care | Payer: Self-pay | Source: Ambulatory Visit | Attending: Obstetrics and Gynecology

## 2022-11-02 ENCOUNTER — Ambulatory Visit (HOSPITAL_COMMUNITY): Payer: 59 | Admitting: Physician Assistant

## 2022-11-02 DIAGNOSIS — K219 Gastro-esophageal reflux disease without esophagitis: Secondary | ICD-10-CM | POA: Diagnosis not present

## 2022-11-02 DIAGNOSIS — N938 Other specified abnormal uterine and vaginal bleeding: Secondary | ICD-10-CM | POA: Insufficient documentation

## 2022-11-02 DIAGNOSIS — E669 Obesity, unspecified: Secondary | ICD-10-CM | POA: Insufficient documentation

## 2022-11-02 DIAGNOSIS — J45909 Unspecified asthma, uncomplicated: Secondary | ICD-10-CM | POA: Insufficient documentation

## 2022-11-02 DIAGNOSIS — M1990 Unspecified osteoarthritis, unspecified site: Secondary | ICD-10-CM | POA: Insufficient documentation

## 2022-11-02 DIAGNOSIS — D259 Leiomyoma of uterus, unspecified: Secondary | ICD-10-CM

## 2022-11-02 DIAGNOSIS — Z6838 Body mass index (BMI) 38.0-38.9, adult: Secondary | ICD-10-CM | POA: Insufficient documentation

## 2022-11-02 DIAGNOSIS — Z01818 Encounter for other preprocedural examination: Secondary | ICD-10-CM

## 2022-11-02 DIAGNOSIS — N8003 Adenomyosis of the uterus: Secondary | ICD-10-CM

## 2022-11-02 DIAGNOSIS — N939 Abnormal uterine and vaginal bleeding, unspecified: Secondary | ICD-10-CM

## 2022-11-02 DIAGNOSIS — N736 Female pelvic peritoneal adhesions (postinfective): Secondary | ICD-10-CM | POA: Diagnosis not present

## 2022-11-02 HISTORY — PX: CYSTOSCOPY: SHX5120

## 2022-11-02 HISTORY — PX: IUD REMOVAL: SHX5392

## 2022-11-02 HISTORY — PX: ROBOTIC ASSISTED TOTAL HYSTERECTOMY: SHX6085

## 2022-11-02 LAB — POCT PREGNANCY, URINE: Preg Test, Ur: NEGATIVE

## 2022-11-02 SURGERY — HYSTERECTOMY, TOTAL, ROBOT-ASSISTED
Anesthesia: General | Site: Uterus

## 2022-11-02 MED ORDER — MIDAZOLAM HCL 2 MG/2ML IJ SOLN
INTRAMUSCULAR | Status: DC | PRN
  Administered 2022-11-02: 2 mg via INTRAVENOUS

## 2022-11-02 MED ORDER — BUPIVACAINE HCL (PF) 0.5 % IJ SOLN
INTRAMUSCULAR | Status: DC | PRN
  Administered 2022-11-02: 10 mL

## 2022-11-02 MED ORDER — ACETAMINOPHEN 500 MG PO TABS: 1000.0000 mg | ORAL_TABLET | Freq: Four times a day (QID) | ORAL | 0 refills | Status: DC

## 2022-11-02 MED ORDER — FENTANYL CITRATE (PF) 250 MCG/5ML IJ SOLN
INTRAMUSCULAR | Status: DC | PRN
  Administered 2022-11-02: 100 ug via INTRAVENOUS

## 2022-11-02 MED ORDER — SCOPOLAMINE 1 MG/3DAYS TD PT72: 1.0000 | MEDICATED_PATCH | Freq: Once | TRANSDERMAL | Status: DC

## 2022-11-02 MED ORDER — CEFAZOLIN SODIUM-DEXTROSE 2-4 GM/100ML-% IV SOLN
2.0000 g | INTRAVENOUS | Status: AC
  Administered 2022-11-02: 2 g via INTRAVENOUS
  Filled 2022-11-02: qty 100

## 2022-11-02 MED ORDER — PROPOFOL 10 MG/ML IV BOLUS
INTRAVENOUS | Status: AC
  Filled 2022-11-02: qty 20

## 2022-11-02 MED ORDER — DEXAMETHASONE SODIUM PHOSPHATE 10 MG/ML IJ SOLN
INTRAMUSCULAR | Status: DC | PRN
  Administered 2022-11-02: 10 mg via INTRAVENOUS

## 2022-11-02 MED ORDER — ONDANSETRON HCL 4 MG/2ML IJ SOLN
INTRAMUSCULAR | Status: DC | PRN
  Administered 2022-11-02: 4 mg via INTRAVENOUS

## 2022-11-02 MED ORDER — ONDANSETRON HCL 4 MG PO TABS: 4.0000 mg | ORAL_TABLET | Freq: Four times a day (QID) | ORAL | Status: DC | PRN

## 2022-11-02 MED ORDER — ROCURONIUM BROMIDE 10 MG/ML (PF) SYRINGE
PREFILLED_SYRINGE | INTRAVENOUS | Status: DC | PRN
  Administered 2022-11-02: 30 mg via INTRAVENOUS
  Administered 2022-11-02: 70 mg via INTRAVENOUS

## 2022-11-02 MED ORDER — KETOROLAC TROMETHAMINE 30 MG/ML IJ SOLN
INTRAMUSCULAR | Status: DC | PRN
  Administered 2022-11-02: 30 mg via INTRAVENOUS

## 2022-11-02 MED ORDER — IBUPROFEN 600 MG PO TABS: 600.0000 mg | ORAL_TABLET | Freq: Four times a day (QID) | ORAL | 2 refills | Status: DC

## 2022-11-02 MED ORDER — METHYLENE BLUE 1 % INJ SOLN
INTRAVENOUS | Status: AC
  Filled 2022-11-02: qty 10

## 2022-11-02 MED ORDER — BUPIVACAINE HCL (PF) 0.5 % IJ SOLN
INTRAMUSCULAR | Status: AC
  Filled 2022-11-02: qty 30

## 2022-11-02 MED ORDER — ORAL CARE MOUTH RINSE: 15.0000 mL | Freq: Once | OROMUCOSAL | Status: AC

## 2022-11-02 MED ORDER — FENTANYL CITRATE (PF) 100 MCG/2ML IJ SOLN
INTRAMUSCULAR | Status: AC
  Filled 2022-11-02: qty 2

## 2022-11-02 MED ORDER — PHENYLEPHRINE 80 MCG/ML (10ML) SYRINGE FOR IV PUSH (FOR BLOOD PRESSURE SUPPORT)
PREFILLED_SYRINGE | INTRAVENOUS | Status: DC | PRN
  Administered 2022-11-02: 160 ug via INTRAVENOUS

## 2022-11-02 MED ORDER — IBUPROFEN 600 MG PO TABS
600.0000 mg | ORAL_TABLET | Freq: Once | ORAL | Status: AC
  Administered 2022-11-02: 600 mg via ORAL
  Filled 2022-11-02 (×2): qty 1

## 2022-11-02 MED ORDER — KETAMINE HCL 50 MG/5ML IJ SOSY
PREFILLED_SYRINGE | INTRAMUSCULAR | Status: AC
  Filled 2022-11-02: qty 5

## 2022-11-02 MED ORDER — FENTANYL CITRATE (PF) 250 MCG/5ML IJ SOLN
INTRAMUSCULAR | Status: AC
  Filled 2022-11-02: qty 5

## 2022-11-02 MED ORDER — LIDOCAINE 2% (20 MG/ML) 5 ML SYRINGE
INTRAMUSCULAR | Status: DC | PRN
  Administered 2022-11-02: 100 mg via INTRAVENOUS

## 2022-11-02 MED ORDER — SUGAMMADEX SODIUM 200 MG/2ML IV SOLN
INTRAVENOUS | Status: DC | PRN
  Administered 2022-11-02: 200 mg via INTRAVENOUS

## 2022-11-02 MED ORDER — OXYCODONE HCL 5 MG PO TABS: 5.0000 mg | ORAL_TABLET | ORAL | 0 refills | Status: DC | PRN

## 2022-11-02 MED ORDER — STERILE WATER FOR IRRIGATION IR SOLN
Status: DC | PRN
  Administered 2022-11-02: 1000 mL

## 2022-11-02 MED ORDER — LACTATED RINGERS IV SOLN: INTRAVENOUS | Status: DC

## 2022-11-02 MED ORDER — ACETAMINOPHEN 500 MG PO TABS
1000.0000 mg | ORAL_TABLET | Freq: Once | ORAL | Status: AC
  Administered 2022-11-02: 1000 mg via ORAL

## 2022-11-02 MED ORDER — ROCURONIUM BROMIDE 10 MG/ML (PF) SYRINGE
PREFILLED_SYRINGE | INTRAVENOUS | Status: AC
  Filled 2022-11-02: qty 10

## 2022-11-02 MED ORDER — ACETAMINOPHEN 500 MG PO TABS
1000.0000 mg | ORAL_TABLET | Freq: Four times a day (QID) | ORAL | Status: DC
  Administered 2022-11-02 – 2022-11-03 (×3): 1000 mg via ORAL
  Filled 2022-11-02 (×3): qty 2

## 2022-11-02 MED ORDER — POVIDONE-IODINE 10 % EX SWAB
2.0000 | Freq: Once | CUTANEOUS | Status: AC
  Administered 2022-11-02: 2 via TOPICAL

## 2022-11-02 MED ORDER — FLUORESCEIN SODIUM 10 % IV SOLN
INTRAVENOUS | Status: AC
  Filled 2022-11-02: qty 5

## 2022-11-02 MED ORDER — FENTANYL CITRATE (PF) 100 MCG/2ML IJ SOLN
25.0000 ug | INTRAMUSCULAR | Status: DC | PRN
  Administered 2022-11-02 (×2): 50 ug via INTRAVENOUS

## 2022-11-02 MED ORDER — OXYCODONE HCL 5 MG PO TABS
5.0000 mg | ORAL_TABLET | ORAL | Status: DC | PRN
  Administered 2022-11-02: 5 mg via ORAL
  Filled 2022-11-02: qty 1

## 2022-11-02 MED ORDER — OXYCODONE HCL 5 MG PO TABS
ORAL_TABLET | ORAL | Status: AC
  Filled 2022-11-02: qty 1

## 2022-11-02 MED ORDER — PROPOFOL 10 MG/ML IV BOLUS
INTRAVENOUS | Status: DC | PRN
  Administered 2022-11-02: 170 mg via INTRAVENOUS

## 2022-11-02 MED ORDER — MIDAZOLAM HCL 2 MG/2ML IJ SOLN
INTRAMUSCULAR | Status: AC
  Filled 2022-11-02: qty 2

## 2022-11-02 MED ORDER — ACETAMINOPHEN 500 MG PO TABS
1000.0000 mg | ORAL_TABLET | ORAL | Status: AC
  Administered 2022-11-02: 1000 mg via ORAL
  Filled 2022-11-02: qty 2

## 2022-11-02 MED ORDER — PROMETHAZINE HCL 25 MG/ML IJ SOLN
6.2500 mg | INTRAMUSCULAR | Status: DC | PRN
  Administered 2022-11-02: 6.25 mg via INTRAVENOUS

## 2022-11-02 MED ORDER — CHLORHEXIDINE GLUCONATE 0.12 % MT SOLN
15.0000 mL | Freq: Once | OROMUCOSAL | Status: AC
  Administered 2022-11-02: 15 mL via OROMUCOSAL
  Filled 2022-11-02: qty 15

## 2022-11-02 MED ORDER — KETAMINE HCL 10 MG/ML IJ SOLN
INTRAMUSCULAR | Status: DC | PRN
  Administered 2022-11-02: 25 mg via INTRAVENOUS

## 2022-11-02 MED ORDER — FLUORESCEIN SODIUM 10 % IV SOLN
INTRAVENOUS | Status: DC | PRN
  Administered 2022-11-02: .25 mL via INTRAVENOUS

## 2022-11-02 MED ORDER — HYDROMORPHONE HCL 1 MG/ML IJ SOLN
INTRAMUSCULAR | Status: AC
  Filled 2022-11-02: qty 0.5

## 2022-11-02 MED ORDER — LIDOCAINE 2% (20 MG/ML) 5 ML SYRINGE
INTRAMUSCULAR | Status: AC
  Filled 2022-11-02: qty 5

## 2022-11-02 MED ORDER — ACETAMINOPHEN 500 MG PO TABS
ORAL_TABLET | ORAL | Status: AC
  Filled 2022-11-02: qty 2

## 2022-11-02 MED ORDER — OXYCODONE HCL 5 MG PO TABS
5.0000 mg | ORAL_TABLET | Freq: Once | ORAL | Status: AC
  Administered 2022-11-02: 5 mg via ORAL

## 2022-11-02 MED ORDER — IBUPROFEN 600 MG PO TABS: 600.0000 mg | ORAL_TABLET | Freq: Four times a day (QID) | ORAL | Status: DC

## 2022-11-02 MED ORDER — POLYETHYLENE GLYCOL 3350 17 G PO PACK: 17.0000 g | PACK | Freq: Every day | ORAL | 0 refills | Status: DC | PRN

## 2022-11-02 MED ORDER — KETOROLAC TROMETHAMINE 30 MG/ML IJ SOLN
INTRAMUSCULAR | Status: AC
  Filled 2022-11-02: qty 1

## 2022-11-02 MED ORDER — SODIUM CHLORIDE 0.9 % IR SOLN
Status: DC | PRN
  Administered 2022-11-02: 1000 mL

## 2022-11-02 MED ORDER — PROMETHAZINE HCL 25 MG/ML IJ SOLN
INTRAMUSCULAR | Status: AC
  Filled 2022-11-02: qty 1

## 2022-11-02 MED ORDER — HYDROMORPHONE HCL 1 MG/ML IJ SOLN
INTRAMUSCULAR | Status: DC | PRN
  Administered 2022-11-02: .5 mg via INTRAVENOUS

## 2022-11-02 MED ORDER — ONDANSETRON HCL 4 MG/2ML IJ SOLN: 4.0000 mg | Freq: Four times a day (QID) | INTRAMUSCULAR | Status: DC | PRN

## 2022-11-02 MED ORDER — DEXAMETHASONE SODIUM PHOSPHATE 10 MG/ML IJ SOLN
INTRAMUSCULAR | Status: AC
  Filled 2022-11-02: qty 1

## 2022-11-02 MED ORDER — HYDROMORPHONE HCL 1 MG/ML IJ SOLN: 0.2000 mg | INTRAMUSCULAR | Status: DC | PRN

## 2022-11-02 MED ORDER — SCOPOLAMINE 1 MG/3DAYS TD PT72
MEDICATED_PATCH | TRANSDERMAL | Status: AC
  Administered 2022-11-02: 1.5 mg via TRANSDERMAL
  Filled 2022-11-02: qty 1

## 2022-11-02 MED ORDER — KETOROLAC TROMETHAMINE 30 MG/ML IJ SOLN
30.0000 mg | Freq: Four times a day (QID) | INTRAMUSCULAR | Status: DC
  Administered 2022-11-02 – 2022-11-03 (×3): 30 mg via INTRAVENOUS
  Filled 2022-11-02 (×3): qty 1

## 2022-11-02 MED ORDER — POLYETHYLENE GLYCOL 3350 17 G PO PACK: 17.0000 g | PACK | Freq: Every day | ORAL | Status: DC | PRN

## 2022-11-02 SURGICAL SUPPLY — 63 items
ADH SKN CLS APL DERMABOND .7 (GAUZE/BANDAGES/DRESSINGS) ×3
APL SRG 38 LTWT LNG FL B (MISCELLANEOUS)
APPLICATOR ARISTA FLEXITIP XL (MISCELLANEOUS) IMPLANT
BARRIER ADHS 3X4 INTERCEED (GAUZE/BANDAGES/DRESSINGS) IMPLANT
BRR ADH 4X3 ABS CNTRL BYND (GAUZE/BANDAGES/DRESSINGS)
CANISTER SUCT 3000ML PPV (MISCELLANEOUS) IMPLANT
CATH FOLEY 3WAY  5CC 16FR (CATHETERS) ×3
CATH FOLEY 3WAY 5CC 16FR (CATHETERS) IMPLANT
COVER BACK TABLE 60X90IN (DRAPES) ×3 IMPLANT
COVER TIP SHEARS 8 DVNC (MISCELLANEOUS) ×4 IMPLANT
COVER TIP SHEARS 8MM DA VINCI (MISCELLANEOUS) ×3
DEFOGGER SCOPE WARMER CLEARIFY (MISCELLANEOUS) ×3 IMPLANT
DERMABOND ADVANCED .7 DNX12 (GAUZE/BANDAGES/DRESSINGS) ×4 IMPLANT
DRAPE ARM DVNC X/XI (DISPOSABLE) ×12 IMPLANT
DRAPE COLUMN DVNC XI (DISPOSABLE) ×3 IMPLANT
DRAPE DA VINCI XI ARM (DISPOSABLE) ×12
DRAPE DA VINCI XI COLUMN (DISPOSABLE) ×3
DRAPE UTILITY XL STRL (DRAPES) ×3 IMPLANT
DURAPREP 26ML APPLICATOR (WOUND CARE) ×3 IMPLANT
ELECT REM PT RETURN 9FT ADLT (ELECTROSURGICAL) ×3
ELECTRODE REM PT RTRN 9FT ADLT (ELECTROSURGICAL) ×4 IMPLANT
GAUZE 4X4 16PLY ~~LOC~~+RFID DBL (SPONGE) IMPLANT
GLOVE BIO SURGEON STRL SZ7 (GLOVE) ×8 IMPLANT
GLOVE BIOGEL PI IND STRL 7.0 (GLOVE) ×12 IMPLANT
GOWN STRL REUS W/TWL LRG LVL3 (GOWN DISPOSABLE) ×12 IMPLANT
GOWN STRL REUS W/TWL XL LVL3 (GOWN DISPOSABLE) ×4 IMPLANT
HEMOSTAT ARISTA ABSORB 3G PWDR (HEMOSTASIS) ×4 IMPLANT
HOLDER FOLEY CATH W/STRAP (MISCELLANEOUS) ×4 IMPLANT
IRRIG SUCT STRYKERFLOW 2 WTIP (MISCELLANEOUS) ×3
IRRIGATION SUCT STRKRFLW 2 WTP (MISCELLANEOUS) ×3 IMPLANT
KIT TURNOVER KIT A (KITS) ×3 IMPLANT
LEGGING LITHOTOMY PAIR STRL (DRAPES) ×4 IMPLANT
NS IRRIG 1000ML POUR BTL (IV SOLUTION) ×3 IMPLANT
OBTURATOR OPTICAL STANDARD 8MM (TROCAR) ×3
OBTURATOR OPTICAL STND 8 DVNC (TROCAR) ×3
OBTURATOR OPTICALSTD 8 DVNC (TROCAR) ×3 IMPLANT
PACK ROBOT WH (CUSTOM PROCEDURE TRAY) ×4 IMPLANT
PACK ROBOTIC GOWN (GOWN DISPOSABLE) ×4 IMPLANT
PAD OB MATERNITY 4.3X12.25 (PERSONAL CARE ITEMS) ×3 IMPLANT
PAD POSITIONING PINK XL (MISCELLANEOUS) ×3 IMPLANT
SEAL CANN UNIV 5-8 DVNC XI (MISCELLANEOUS) ×16 IMPLANT
SEAL XI 5MM-8MM UNIVERSAL (MISCELLANEOUS) ×12
SET TRI-LUMEN FLTR TB AIRSEAL (TUBING) ×4 IMPLANT
SPIKE FLUID TRANSFER (MISCELLANEOUS) ×12 IMPLANT
SUT DVC VLOC 180 0 12IN GS21 (SUTURE)
SUT VIC AB 0 CT1 27 (SUTURE) ×3
SUT VIC AB 0 CT1 27XBRD ANBCTR (SUTURE) IMPLANT
SUT VIC AB 4-0 PS2 27 (SUTURE) IMPLANT
SUT VLOC 180 0 9IN  GS21 (SUTURE) ×3
SUT VLOC 180 0 9IN GS21 (SUTURE) IMPLANT
SUT VLOC 180 2-0 9IN GS21 (SUTURE) IMPLANT
SUTURE DVC VLC 180 0 12IN GS21 (SUTURE) IMPLANT
SYS BAG RETRIEVAL 10MM (BASKET)
SYSTEM BAG RETRIEVAL 10MM (BASKET) IMPLANT
TIP RUMI ORANGE 6.7MMX12CM (TIP) IMPLANT
TIP UTERINE 5.1X6CM LAV DISP (MISCELLANEOUS) IMPLANT
TIP UTERINE 6.7X10CM GRN DISP (MISCELLANEOUS) IMPLANT
TIP UTERINE 6.7X6CM WHT DISP (MISCELLANEOUS) IMPLANT
TIP UTERINE 6.7X8CM BLUE DISP (MISCELLANEOUS) IMPLANT
TOWEL GREEN STERILE (TOWEL DISPOSABLE) ×4 IMPLANT
TRAY FOLEY W/BAG SLVR 14FR (SET/KITS/TRAYS/PACK) ×4 IMPLANT
TROCAR PORT AIRSEAL 8X120 (TROCAR) IMPLANT
UNDERPAD 30X36 HEAVY ABSORB (UNDERPADS AND DIAPERS) ×4 IMPLANT

## 2022-11-02 NOTE — Discharge Instructions (Signed)
For the first 3 days, take tylenol and ibuprofen every 6 hours, regardless of how you feel. These can be taken together or alternated. Take oxycodone as needed for severe pain. I have also sent miralax for constipation.  You will have an appointment in 2-3 weeks virtually and in 6-8 weeks in person.

## 2022-11-02 NOTE — Anesthesia Procedure Notes (Signed)
Procedure Name: Intubation Date/Time: 11/02/2022 8:55 AM  Performed by: Mosetta Pigeon, CRNAPre-anesthesia Checklist: Patient identified, Emergency Drugs available, Suction available and Patient being monitored Patient Re-evaluated:Patient Re-evaluated prior to induction Oxygen Delivery Method: Circle System Utilized Preoxygenation: Pre-oxygenation with 100% oxygen Induction Type: IV induction Ventilation: Mask ventilation without difficulty Laryngoscope Size: Mac and 3 Grade View: Grade II Tube type: Oral Number of attempts: 1 Airway Equipment and Method: Stylet Placement Confirmation: ETT inserted through vocal cords under direct vision, positive ETCO2 and breath sounds checked- equal and bilateral Secured at: 22 cm Tube secured with: Tape Dental Injury: Teeth and Oropharynx as per pre-operative assessment

## 2022-11-02 NOTE — Anesthesia Postprocedure Evaluation (Signed)
Anesthesia Post Note  Patient: Samantha Francis  Procedure(s) Performed: XI ROBOTIC ASSISTED TOTAL HYSTERECTOMY WITH BILATERAL SALPINGECTOMY/FILSHE CLIPS (Bilateral: Uterus) CYSTOSCOPY INTRAUTERINE DEVICE (IUD) REMOVAL (Cervix)     Patient location during evaluation: PACU Anesthesia Type: General Level of consciousness: awake and alert Pain management: pain level controlled Vital Signs Assessment: post-procedure vital signs reviewed and stable Respiratory status: spontaneous breathing, nonlabored ventilation, respiratory function stable and patient connected to nasal cannula oxygen Cardiovascular status: blood pressure returned to baseline and stable Postop Assessment: no apparent nausea or vomiting Anesthetic complications: no   No notable events documented.  Last Vitals:  Vitals:   11/02/22 1215 11/02/22 1230  BP: (!) 141/94 139/86  Pulse: 85 84  Resp: 13 14  Temp:    SpO2: 92% 94%    Last Pain:  Vitals:   11/02/22 1215  TempSrc:   PainSc: [place]

## 2022-11-02 NOTE — Transfer of Care (Signed)
Immediate Anesthesia Transfer of Care Note  Patient: Samantha Francis  Procedure(s) Performed: XI ROBOTIC ASSISTED TOTAL HYSTERECTOMY WITH BILATERAL SALPINGECTOMY/FILSHE CLIPS (Bilateral: Uterus) CYSTOSCOPY INTRAUTERINE DEVICE (IUD) REMOVAL (Cervix)  Patient Location: PACU  Anesthesia Type:General  Level of Consciousness: drowsy and patient cooperative  Airway & Oxygen Therapy: Patient Spontanous Breathing and Patient connected to nasal cannula oxygen  Post-op Assessment: Report given to RN and Post -op Vital signs reviewed and stable  Post vital signs: Reviewed and stable  Last Vitals:  Vitals Value Taken Time  BP 150/89 11/02/22 1120  Temp 36.6 C 11/02/22 1120  Pulse 92 11/02/22 1120  Resp 19 11/02/22 1120  SpO2 99 % 11/02/22 1120  Vitals shown include unvalidated device data.  Last Pain:  Vitals:   11/02/22 0729  TempSrc:   PainSc: 0-No pain         Complications: No notable events documented.

## 2022-11-02 NOTE — Op Note (Signed)
[name] PROCEDURE DATE: 11/02/2022  PREOPERATIVE DIAGNOSIS: AUB  POSTOPERATIVE DIAGNOSIS: AUB PROCEDURE:    IUD removal, Robotic-assisted total laparoscopic hysterectomy, bilateral salpingectomy, cystoscopy SURGEON: Darliss Cheney, MD ASSISTANT:  Dr. Verita Schneiders.  An experienced assistant was required given the standard of surgical care given the complexity of the case.  This assistant was needed for exposure, dissection, suctioning, retraction, instrument exchange, and for overall help during the procedure.  INDICATIONS: 42 y.o. [id number] with AUB.  Risks of surgery were discussed with the patient including but not limited to: bleeding which may require transfusion; infection which may require antibiotics; injury to surrounding organs; need for additional procedures including laparotomy;  and other postoperative/anesthesia complications. Written informed consent was obtained.    FINDINGS:   EUA: Normal external genitalia, 10 wk size mobile/immobile uterus with Normal contours, IUD strings palpated Laparoscopically: midline omental adhesion extending to the level of the umbilicus, slightly enlarged uterus, filshie clips on bilateral fallopian tubes, normal bilateral ovaries, bilateral ureters seen, thin posterior adhesion of the uterus to the mesentery of colon, adhesions of the uterus throughout the anterior cul de sac, unremarkable posterior cul de sac Cystoscopically: normal bladder wall without apparent injury, bilateral ureteral orifices, fluorescein-stained urine from bilateral ureteral orifices   ANESTHESIA: General ESTIMATED BLOOD LOSS:  20cc URINE OUTPUT: 400cc SPECIMENS: uterus, cervix, bilateral fallopian tubes, sterilization clips COMPLICATIONS:  None immediate.  PROCEDURE:  The risks, benefits, and alternatives of surgery were explained, understood, and accepted. Consents were signed. All questions were answered. She was taken to the operating room and general  anesthesia was applied without complication. She was placed in the dorsal lithotomy position.  A bimanual exam revealed a 10week size uterus that was mobile. Her adnexa were not enlarged. The IUD strings were grasped and the IUD removed intact. Her abdomen and vagina were prepped and draped after she had been carefully positioned on the table. A Foley catheter was placed and it drained clear throughout the case.The cervix was measured and the uterus was sounded to 8 cm. A Rumi uterine manipulator was placed without difficulty.  Gloves and gowns were changed and attention was turned to the abdomen. The prior LUQ incision was identified and noted to be appropriate for entry. The skin was infiltrated with 0.5 bupivacaine and 38m incision made. The laparoscope and optiview trocar were introduced into the abdomen and intraabdominal entry was confirmed with visualization and low opening pressure.  The abdominopelvic cavity was surveyed and the findings above were noted. She was placed in Trendelenburg position and ports were placed in appropriate positions on her abdomen to allow maximum exposure during the robotic case, all incisions infiltrated with bupivacaine. 835mtrocars were placed within the umbilicus, in the LLQ, RLQ and RUQ under direct visualization. The robot was docked and I proceeded with a robotic portion of the case.  The pelvis was inspected and the uterus was found to have adhesions and filshie clips. The left fallopian tube were identified and the mesosalpinx serially coagulated and divided just beyond the filshie clips and the fallopian tube and clip were removed from the abdomen.  Next the round ligament was identified coagulated and divided.  A window was made in the posterior leaf of the broad ligament and the utero-ovarian ligament was identified, cauterized, and divided.  The anterior leaf of the broad ligament was then dissected and divided down to the level of the bladder flap.  Care was taken  to follow areolar tissue and avoid the bladder. The bladder flap  was developed down to the endopelvic fascia and the bladder moved inferiorly. Posterior leaf of the broad ligament was dissected down to the level of the cervical cup. The ureter was identified transperitoneally. The uterine vessels were then further skeletonized, cauterized, divided and lateralized to the cup edge. The same was then carried out on the contralateral side.  The bladder was confirmed to be pushed out of the operative site and a posterior colpotomy was made. The colpotomy incision was extended circumferentially, following the blue outline of the Rumi manipulator. All pedicles were hemostatic.  The uterus was removed from the vagina. The vaginal cuff was closed with v-lock suture.  Excellent hemostasis was noted throughout. The pelvis was irrigated. The intraabdominal pressure was lowered assess hemostasis. After determining excellent hemostasis, the robot was undocked. At this point I performed cystoscopy. The cystoscopy revealed fluorescein ejection from both ureters.  The skin from all of the ports was closed with 4-0 vicryl. The patient was then extubated and taken to recovery in stable condition.   Sponge, lap and needle counts were correct x 2.

## 2022-11-02 NOTE — Brief Op Note (Signed)
11/02/2022  11:09 AM  PATIENT:  Samantha Francis  42 y.o. female  PRE-OPERATIVE DIAGNOSIS:  AUB  POST-OPERATIVE DIAGNOSIS:  AUB  PROCEDURE:  Procedure(s): XI ROBOTIC ASSISTED TOTAL HYSTERECTOMY WITH BILATERAL SALPINGECTOMY/FILSHE CLIPS (Bilateral) CYSTOSCOPY (N/A) INTRAUTERINE DEVICE (IUD) REMOVAL  SURGEON:  Surgeon(s) and Role:    Darliss Cheney, MD - Primary    * Anyanwu, Sallyanne Havers, MD - Assisting   ASSISTANTS: Dr. Harolyn Rutherford   ANESTHESIA:   general  EBL:  20 mL   BLOOD ADMINISTERED:none  DRAINS: none   LOCAL MEDICATIONS USED:  BUPIVICAINE   SPECIMEN:  Source of Specimen:  uterus, cervix, bilateral fallopian tubes and sterilization clips  DISPOSITION OF SPECIMEN:  PATHOLOGY  COUNTS:  YES  TOURNIQUET:  * No tourniquets in log *  DICTATION: .Note written in EPIC  PLAN OF CARE: Discharge to home after PACU  PATIENT DISPOSITION:  PACU - hemodynamically stable.   Delay start of Pharmacological VTE agent (>24hrs) due to surgical blood loss or risk of bleeding: not applicable

## 2022-11-02 NOTE — H&P (Signed)
OB/GYN Pre-Op History and Physical  Samantha Francis is a 42 y.o. [id number] presenting for AUB.       Past Medical History:  Diagnosis Date   Allergic rhinitis    Allergy    Anemia    not with current pregnancy   Asthma    childhood but never used an inhaler   GERD (gastroesophageal reflux disease)    with pregnancy   History of uterine fibroid    HSV (herpes simplex virus) anogenital infection    PONV (postoperative nausea and vomiting)    Spinal headache 2009   With delivery of daughter. Pt had epidural.    Past Surgical History:  Procedure Laterality Date   CESAREAN SECTION  2009   CESAREAN SECTION N/A 06/10/2014   Procedure: CESAREAN SECTION;  Surgeon: Allyn Kenner, DO;  Location: [place] [facility];  Service: Obstetrics;  Laterality: N/A;   CESAREAN SECTION N/A 04/20/2016   Procedure: CESAREAN SECTION;  Surgeon: Donnamae Jude, MD;  Location: [place];  Service: Obstetrics;  Laterality: N/A;   MYOMECTOMY     TUBAL LIGATION Bilateral 04/20/2016   Procedure: BILATERAL TUBAL LIGATION;  Surgeon: Donnamae Jude, MD;  Location: [place];  Service: Obstetrics;  Laterality: Bilateral;   XI ROBOTIC ASSISTED VENTRAL HERNIA N/A 07/20/2021   Procedure: XI ROBOTIC ASSISTED VENTRAL HERNIA WITH MESH;  Surgeon: Olean Ree, MD;  Location: [facility];  Service: General;  Laterality: N/A;    OB History  Gravida Para Term Preterm AB Living  '4 3 2   1 3  '$ SAB IAB Ectopic Multiple Live Births  1     0 3    # Outcome Date GA Lbr Len/2nd Weight Sex Delivery Anes PTL Lv  4 Term 04/20/16 [redacted]w[redacted]d 2635 g M CS-LTranv Spinal, EPI  LIV  3 Para 06/10/14   2895 g M CS-LTranv Spinal  LIV  2 SAB 2011          1 Term 04/10/08 [id number] g F CS-LTranv EPI N LIV    Social History   Socioeconomic History   Marital status: Legally Separated    Spouse name: Not on file   Number of children: Not on file   Years of education: Not on file   Highest education level: Not on file   Occupational History   Occupation: community support specialist  Tobacco Use   Smoking status: Never   Smokeless tobacco: Never  Vaping Use   Vaping Use: Never used  Substance and Sexual Activity   Alcohol use: Yes    Comment: Rarely   Drug use: No   Sexual activity: Yes    Birth control/protection: Surgical    Comment: plans to discuss with provider 11/23 at appt  Other Topics Concern   Not on file  Social History Narrative   Not on file   Social Determinants of Health   Financial Resource Strain: Not on file  Food Insecurity: Not on file  Transportation Needs: Not on file  Physical Activity: Not on file  Stress: Not on file  Social Connections: Not on file    Family History  Problem Relation Age of Onset   Hyperlipidemia Maternal Grandfather    Hypertension Maternal Grandfather    Hypertension Father    Hyperlipidemia Mother    Diabetes Sister    Hyperlipidemia Sister    Ovarian cancer Maternal Aunt    Diabetes Maternal Aunt    Breast cancer Cousin [phone]  Medications Prior to  Admission  Medication Sig Dispense Refill Last Dose   megestrol (MEGACE) 40 MG tablet Take 2 tablets (80 mg total) by mouth daily. (Patient taking differently: Take 40 mg by mouth daily.) 30 tablet 3 11/01/2022    Allergies  Allergen Reactions   Doxycycline Nausea And Vomiting    Review of Systems: Negative except for what is mentioned in HPI.     Physical Exam: BP 135/89   Pulse 84   Temp 98 F (36.7 C) (Oral)   Resp 18   Ht 5' 2.75" (1.594 m)   Wt 97.6 kg   LMP 10/17/2022   SpO2 100%   BMI 38.41 kg/m  CONSTITUTIONAL: Well-developed, well-nourished female in no acute distress.  HENT:  Normocephalic, atraumatic, External right and left ear normal. Oropharynx is clear and moist EYES: Conjunctivae and EOM are normal. Pupils are equal, round, and reactive to light. No scleral icterus.  NECK: Normal range of motion, supple, no masses SKIN: Skin is warm and dry. No rash noted.  Not diaphoretic. No erythema. No pallor. [place]: Alert and oriented to person, place, and time. Normal reflexes, muscle tone coordination. No cranial nerve deficit noted. PSYCHIATRIC: Normal mood and affect. Normal behavior. Normal judgment and thought content. RESPIRATORY: normal effort PELVIC: Deferred MUSCULOSKELETAL: Normal range of motion. No edema and no tenderness. 2+ distal pulses.   Pertinent Labs/Studies:   Results for orders placed or performed during the hospital encounter of 11/02/22 (from the past 72 hour(s))  Pregnancy, urine POC     Status: None   Collection Time: 11/02/22  7:31 AM  Result Value Ref Range   Preg Test, Ur NEGATIVE NEGATIVE    Comment:        THE SENSITIVITY OF THIS METHODOLOGY IS >24 mIU/mL        Assessment and Plan :Samantha Francis is a 42 y.o. [id number] here for AUB.   Plan for RA-TLH, BS, cysto possible open NPO Preop labs wnl  Reviewed risks, benefits with patient  Intend same day, possible overnight stay   Darliss Cheney, M.D. Minimally Invasive Gynecologic Surgery and Pelvic Pain Specialist Attending [place], [place] for [employer], [place]

## 2022-11-03 ENCOUNTER — Encounter (HOSPITAL_COMMUNITY): Payer: Self-pay | Admitting: Obstetrics and Gynecology

## 2022-11-03 DIAGNOSIS — D259 Leiomyoma of uterus, unspecified: Secondary | ICD-10-CM | POA: Diagnosis not present

## 2022-11-03 DIAGNOSIS — N939 Abnormal uterine and vaginal bleeding, unspecified: Secondary | ICD-10-CM | POA: Diagnosis not present

## 2022-11-03 DIAGNOSIS — N938 Other specified abnormal uterine and vaginal bleeding: Secondary | ICD-10-CM | POA: Diagnosis not present

## 2022-11-03 LAB — SURGICAL PATHOLOGY

## 2022-11-03 NOTE — Progress Notes (Signed)
Discharge instructions given and reviewed with patient, patient  understands post-op care given on AVS, medications regimen, f/u appointment with MD.

## 2022-11-03 NOTE — Progress Notes (Signed)
LATE ENTRY  In to see patient postoperatively. Doing well. Tolerating PO. Ambulatory and voiding. Pain well controlled at this time, now 5/10. Overall doing ok but would prefer to stay in house overnight.    PE:  NAD Normal respiratory effort  A/P Meeting postoperative goals. Overnight obs and d/c in the AM assuming no complications overnight.   Darliss Cheney, MD

## 2022-11-03 NOTE — Progress Notes (Signed)
Gynecology Progress Note  Admission Date: 11/02/2022 Current Date: 11/03/2022 7:48 AM  Samantha Francis is a 42 y.o. [id number] HD#2/POD#1 admitted for postop observation.    History complicated by: Patient Active Problem List   Diagnosis Date Noted   Abnormal uterine bleeding (AUB) 11/02/2022   Abnormal uterine bleeding 11/02/2022   HSV-2 infection 06/02/2021   Ventral hernia without obstruction or gangrene 06/02/2021   Obesity (BMI 35.0-39.9 without comorbidity) 07/15/2019   IUD (intrauterine device) in place 07/14/2019   Iron deficiency anemia due to chronic blood loss 07/13/2018   Family history of ovarian cancer 05/17/2018   Adenomyosis of uterus 05/17/2018   Lateral epicondylitis of left elbow 04/19/2017   Left elbow pain 04/19/2017   Genital HSV 02/29/2016   Sebaceous cyst 02/03/2015    ROS and patient/family/surgical history, located on admission H&P note dated 11/02/2022, have been reviewed, and there are no changes except as noted below Yesterday/Overnight Events:  No acute events  Subjective:  Doing well this morning. Tolerating PO. Voiding, not yet passing flatus. No chest pain, SOB or lightheadedness. No pain this morning after getting additional pain medications.   Objective:   Vitals:   11/02/22 1450 11/02/22 1546 11/02/22 1952 11/02/22 2339  BP:  (!) 140/84 (!) 142/76 118/60  Pulse:  67 100 67  Resp:  '17 18 16  '$ Temp:  98 F (36.7 C) 98.4 F (36.9 C) 98.6 F (37 C)  TempSrc:  Oral Oral Oral  SpO2: 98% 100% 98% 98%  Weight:      Height:        Temp:  [97.9 F (36.6 C)-98.6 F (37 C)] 98.6 F (37 C) (03/06 2339) Pulse Rate:  [67-107] 67 (03/06 2339) Resp:  [10-27] 16 (03/06 2339) BP: (118-150)/(60-94) 118/60 (03/06 2339) SpO2:  [90 %-100 %] 98 % (03/06 2339) I/O last 3 completed shifts: In: 2000 [P.O.:100; I.V.:1800; IV Piggyback:100] Out: 870 [Urine:850; Blood:20] No intake/output data recorded.  Intake/Output Summary (Last 24 hours) at  11/03/2022 0748 Last data filed at 11/02/2022 1640 Gross per 24 hour  Intake 2000 ml  Output 870 ml  Net 1130 ml     Current Vital Signs 24h Vital Sign Ranges  T 98.6 F (37 C) Temp  Avg: 98.1 F (36.7 C)  Min: 97.9 F (36.6 C)  Max: 98.6 F (37 C)  BP 118/60 BP  Min: 118/60  Max: 150/89  HR 67 Pulse  Avg: 83.1  Min: 67  Max: 107  RR 16 Resp  Avg: 13.1  Min: 10  Max: 27  SaO2 98 %  (room air) SpO2  Avg: 95.5 %  Min: 90 %  Max: 100 %       24 Hour I/O Current Shift I/O  Time Ins Outs 03/06 0701 - 03/07 0700 In: 2000 [P.O.:100; I.V.:1800] Out: 870 [Urine:850] No intake/output data recorded.   Patient Vitals for the past 12 hrs:  BP Temp Temp src Pulse Resp SpO2  11/02/22 2339 118/60 98.6 F (37 C) Oral 67 16 98 %  11/02/22 1952 (!) 142/76 98.4 F (36.9 C) Oral 100 18 98 %     Patient Vitals for the past 24 hrs:  BP Temp Temp src Pulse Resp SpO2  11/02/22 2339 118/60 98.6 F (37 C) Oral 67 16 98 %  11/02/22 1952 (!) 142/76 98.4 F (36.9 C) Oral 100 18 98 %  11/02/22 1546 (!) 140/84 98 F (36.7 C) Oral 67 17 100 %  11/02/22 1450 -- -- -- -- --  98 %  11/02/22 1449 (!) 141/88 98 F (36.7 C) Oral 84 15 --  11/02/22 1416 -- -- -- 84 13 94 %  11/02/22 1415 (!) 135/92 97.9 F (36.6 C) -- 79 12 95 %  11/02/22 1414 -- -- -- 77 13 95 %  11/02/22 1413 -- -- -- 84 13 95 %  11/02/22 1412 -- -- -- 80 13 94 %  11/02/22 1411 -- -- -- 79 13 94 %  11/02/22 1410 -- -- -- 80 14 94 %  11/02/22 1409 -- -- -- 80 13 94 %  11/02/22 1408 -- -- -- 82 13 95 %  11/02/22 1407 -- -- -- 76 11 98 %  11/02/22 1406 -- -- -- 91 12 98 %  11/02/22 1405 -- -- -- 82 12 95 %  11/02/22 1404 -- -- -- 82 13 95 %  11/02/22 1403 -- -- -- 81 12 95 %  11/02/22 1402 -- -- -- 82 12 95 %  11/02/22 1401 -- -- -- 79 12 95 %  11/02/22 1400 134/88 -- -- 79 11 94 %  11/02/22 1359 -- -- -- 79 12 94 %  11/02/22 1358 -- -- -- 79 12 94 %  11/02/22 1357 -- -- -- 83 13 94 %  11/02/22 1356 -- -- -- 80 12 95 %   11/02/22 1355 -- -- -- 81 12 95 %  11/02/22 1354 -- -- -- 85 13 94 %  11/02/22 1353 -- -- -- 80 13 96 %  11/02/22 1352 -- -- -- 84 13 93 %  11/02/22 1351 -- -- -- 78 13 94 %  11/02/22 1350 -- -- -- 81 12 95 %  11/02/22 1349 -- -- -- 75 12 95 %  11/02/22 1348 -- -- -- 77 12 94 %  11/02/22 1347 -- -- -- 77 12 94 %  11/02/22 1346 -- -- -- 82 13 96 %  11/02/22 1345 (!) 145/84 -- -- 75 12 95 %  11/02/22 1344 -- -- -- 83 13 94 %  11/02/22 1343 -- -- -- 81 13 95 %  11/02/22 1342 -- -- -- 79 13 98 %  11/02/22 1341 -- -- -- 93 15 98 %  11/02/22 1340 -- -- -- 88 14 94 %  11/02/22 1339 -- -- -- 88 14 92 %  11/02/22 1338 -- -- -- 83 11 94 %  11/02/22 1337 -- -- -- 81 11 94 %  11/02/22 1336 -- -- -- 83 11 95 %  11/02/22 1335 -- -- -- 77 11 94 %  11/02/22 1334 -- -- -- 82 11 93 %  11/02/22 1333 -- -- -- 80 11 94 %  11/02/22 1332 -- -- -- 83 11 94 %  11/02/22 1331 -- -- -- 83 12 93 %  11/02/22 1330 139/88 -- -- 82 12 94 %  11/02/22 1329 -- -- -- 84 10 94 %  11/02/22 1328 -- -- -- 83 10 94 %  11/02/22 1327 -- -- -- 82 11 93 %  11/02/22 1326 -- -- -- 80 12 94 %  11/02/22 1325 -- -- -- 82 11 93 %  11/02/22 1324 -- -- -- 80 10 93 %  11/02/22 1323 -- -- -- 75 10 92 %  11/02/22 1322 -- -- -- 79 11 93 %  11/02/22 1321 -- -- -- 80 11 91 %  11/02/22 1320 -- -- -- 80 12 91 %  11/02/22 1319 -- -- -- 79 10 90 %  11/02/22 1318 -- -- -- 81 11 91 %  11/02/22 1317 -- -- -- 79 11 91 %  11/02/22 1316 -- -- -- 80 10 93 %  11/02/22 1315 (!) 141/89 -- -- 83 11 99 %  11/02/22 1313 -- -- -- 91 17 99 %  11/02/22 1312 -- -- -- 83 14 97 %  11/02/22 1311 -- -- -- 92 15 97 %  11/02/22 1310 -- -- -- 88 13 96 %  11/02/22 1309 -- -- -- 81 12 97 %  11/02/22 1308 -- -- -- 79 11 97 %  11/02/22 1307 -- -- -- 78 12 98 %  11/02/22 1306 -- -- -- 81 12 98 %  11/02/22 1305 -- -- -- 80 12 97 %  11/02/22 1304 -- -- -- 81 11 97 %  11/02/22 1303 -- -- -- 83 13 98 %  11/02/22 1302 -- -- -- 81 12 97 %  11/02/22 1301 -- --  -- 81 12 98 %  11/02/22 1300 137/83 -- -- 79 13 97 %  11/02/22 1259 -- -- -- 80 12 98 %  11/02/22 1258 -- -- -- 80 11 98 %  11/02/22 1257 -- -- -- 82 12 98 %  11/02/22 1256 -- -- -- 81 12 98 %  11/02/22 1255 -- -- -- 82 13 97 %  11/02/22 1254 -- -- -- 82 13 97 %  11/02/22 1253 -- -- -- 83 12 97 %  11/02/22 1252 -- -- -- 82 13 97 %  11/02/22 1251 -- -- -- 83 12 98 %  11/02/22 1250 -- -- -- 82 12 97 %  11/02/22 1249 -- -- -- 83 13 97 %  11/02/22 1248 -- -- -- 82 12 98 %  11/02/22 1247 -- -- -- 81 12 97 %  11/02/22 1246 -- -- -- 83 13 98 %  11/02/22 1245 135/86 -- -- 83 13 96 %  11/02/22 1244 -- -- -- 85 13 96 %  11/02/22 1243 -- -- -- 81 12 97 %  11/02/22 1242 -- -- -- 83 13 97 %  11/02/22 1241 -- -- -- 82 12 97 %  11/02/22 1240 -- -- -- 83 12 97 %  11/02/22 1239 -- -- -- 82 14 97 %  11/02/22 1238 -- -- -- 82 11 99 %  11/02/22 1237 -- -- -- 86 10 97 %  11/02/22 1236 -- -- -- 79 13 99 %  11/02/22 1235 -- -- -- 89 12 99 %  11/02/22 1234 -- -- -- 83 11 98 %  11/02/22 1233 -- -- -- 85 14 99 %  11/02/22 1232 -- -- -- 84 13 96 %  11/02/22 1231 -- -- -- 84 13 95 %  11/02/22 1230 139/86 -- -- 84 14 94 %  11/02/22 1229 -- -- -- 85 12 95 %  11/02/22 1228 -- -- -- 85 14 96 %  11/02/22 1227 -- -- -- 91 14 97 %  11/02/22 1226 -- -- -- 96 14 97 %  11/02/22 1225 -- -- -- 97 16 96 %  11/02/22 1224 -- -- -- (!) 107 (!) 27 96 %  11/02/22 1223 -- -- -- 94 15 92 %  11/02/22 1222 -- -- -- 85 12 93 %  11/02/22 1221 -- -- -- 85 13 92 %  11/02/22 1220 -- -- -- 84 13 92 %  11/02/22 1219 -- -- -- 85 13 90 %  11/02/22 1218 -- -- -- 86   13 92 %  11/02/22 1217 -- -- -- 84 13 91 %  11/02/22 1216 -- -- -- 85 14 93 %  11/02/22 1215 (!) 141/94 -- -- 85 13 92 %  11/02/22 1214 -- -- -- 85 13 91 %  11/02/22 1213 -- -- -- 82 13 92 %  11/02/22 1212 -- -- -- 81 16 94 %  11/02/22 1211 -- -- -- 82 16 97 %  11/02/22 1210 -- -- -- 90 (!) 22 97 %  11/02/22 1209 -- -- -- 89 20 96 %  11/02/22 1208 -- -- -- 89 15  97 %  11/02/22 1207 -- -- -- 96 17 96 %  11/02/22 1206 -- -- -- 85 16 97 %  11/02/22 1205 -- -- -- 86 16 96 %  11/02/22 1204 -- -- -- 87 17 96 %  11/02/22 1203 -- -- -- 87 17 97 %  11/02/22 1202 -- -- -- 85 15 96 %  11/02/22 1201 -- -- -- 88 16 98 %  11/02/22 1200 (!) 138/94 -- -- 86 16 97 %  11/02/22 1145 (!) 144/87 -- -- 87 17 97 %  11/02/22 1130 (!) 140/91 -- -- 85 18 99 %  11/02/22 1120 (!) 150/89 97.9 F (36.6 C) -- 94 17 97 %    Physical exam: General appearance: alert, cooperative, and appears stated age Abdomen: soft, non-tender; bowel sounds normal; no masses,  no organomegaly GU: No gross VB Lungs: clear to auscultation bilaterally Heart: regular rate and rhythm Skin: intact, well approximated laparoscopic incisions with skin glue Psych: appropriate Neurologic: Grossly normal  Medications Current Facility-Administered Medications  Medication Dose Route Frequency Provider Last Rate Last Admin   acetaminophen (TYLENOL) tablet 1,000 mg  1,000 mg Oral Q6H Amando Ishikawa, MD   1,000 mg at 11/03/22 0151   HYDROmorphone (DILAUDID) injection 0.2-0.6 mg  0.2-0.6 mg Intravenous Q2H PRN Emil Klassen, MD       ketorolac (TORADOL) 30 MG/ML injection 30 mg  30 mg Intravenous Q6H Awesome Jared, MD   30 mg at 11/03/22 0644   Followed by   ibuprofen (ADVIL) tablet 600 mg  600 mg Oral Q6H Eleonora Peeler, MD       ondansetron (ZOFRAN) tablet 4 mg  4 mg Oral Q6H PRN Alveria Mcglaughlin, MD       Or   ondansetron (ZOFRAN) injection 4 mg  4 mg Intravenous Q6H PRN Darling Cieslewicz, MD       oxyCODONE (Oxy IR/ROXICODONE) immediate release tablet 5-10 mg  5-10 mg Oral Q4H PRN Rajeev Escue, MD   5 mg at 11/02/22 1652   polyethylene glycol (MIRALAX / GLYCOLAX) packet 17 g  17 g Oral Daily PRN Darliss Cheney, MD          Labs  Recent Labs  Lab 10/28/22 1311  WBC 6.7  HGB 12.9  HCT 38.9  PLT 200    No results for input(s): "NA", "K", "CL", "CO2", "BUN",  "CREATININE", "CALCIUM", "PROT", "BILITOT", "ALKPHOS", "ALT", "AST", "GLUCOSE" in the last 168 hours.  Invalid input(s): "LABALBU"  Radiology N/a  Assessment & Plan:  Doing well *GYN: now s/p uncomplicated RA-TLH, BS, cysto *Pain: well controlled; scheduled NSAIDs and APAP, prn oxycodone *FEN/GI: regular diet, saline lock IV, bowel regimen *PPx: SCDs, IS, ambulation *Dispo: home this AM  Code Status: Full Code    Darliss Cheney, MD [facility] (Faculty Practice) 11/03/22 7:48 AM

## 2022-11-03 NOTE — Discharge Summary (Signed)
Physician Discharge Summary  Patient ID: Samantha Francis MRN: [id number] DOB/AGE: 44-Feb-1982 44 y.o.  Admit date: 11/02/2022 Discharge date: 11/03/2022  Admission Diagnoses: AUB  Discharge Diagnoses:  Principal Problem:   Abnormal uterine bleeding (AUB) Active Problems:   Abnormal uterine bleeding   Discharged Condition: good  Hospital Course: Presented for scheduled RA-TLH, BS, cysto for AUB. Underwent uncomplicated procedure and remained overnight for observation.   Consults: None  Significant Diagnostic Studies: n/a  Treatments: IV hydration and analgesia: acetaminophen and toradol and oxycodone  Discharge Exam: Blood pressure 118/60, pulse 67, temperature 98.6 F (37 C), temperature source Oral, resp. rate 16, height 5' 2.75" (1.594 m), weight 97.6 kg, last menstrual period 10/17/2022, SpO2 98 %. General appearance: alert, cooperative, and appears stated age Resp: clear to auscultation bilaterally Cardio: regular rate and rhythm GI: soft, non-tender; bowel sounds normal; no masses,  no organomegaly Incision/Wound: covered with dermabond, clean/dry/intact x 5  Disposition: Discharge disposition: 01-Home or Self Care       Discharge Instructions     Call MD for:  difficulty breathing, headache or visual disturbances   Complete by: As directed    Call MD for:  persistant dizziness or light-headedness   Complete by: As directed    Call MD for:  persistant nausea and vomiting   Complete by: As directed    Call MD for:  redness, tenderness, or signs of infection (pain, swelling, redness, odor or green/yellow discharge around incision site)   Complete by: As directed    Call MD for:  severe uncontrolled pain   Complete by: As directed    Call MD for:  temperature >100.4   Complete by: As directed    Diet general   Complete by: As directed    Increase activity slowly   Complete by: As directed    Lifting restrictions   Complete by: As directed    No  lifting over 10lbs for 6 weeks/until cleared   Sexual Activity Restrictions   Complete by: As directed    Nothing in vagina for 6-8 weeks/until cleared      Allergies as of 11/03/2022       Reactions   Doxycycline Nausea And Vomiting        Medication List     STOP taking these medications    megestrol 40 MG tablet Commonly known as: MEGACE       TAKE these medications    acetaminophen 500 MG tablet Commonly known as: TYLENOL Take 2 tablets (1,000 mg total) by mouth every 6 (six) hours.   ibuprofen 600 MG tablet Commonly known as: ADVIL Take 1 tablet (600 mg total) by mouth every 6 (six) hours.   oxyCODONE 5 MG immediate release tablet Commonly known as: Oxy IR/ROXICODONE Take 1 tablet (5 mg total) by mouth every 4 (four) hours as needed for severe pain or breakthrough pain.   polyethylene glycol 17 g packet Commonly known as: MIRALAX / GLYCOLAX Take 17 g by mouth daily as needed for mild constipation, moderate constipation or severe constipation.         Signed: Darliss Cheney 11/03/2022, 7:55 AM

## 2022-12-01 ENCOUNTER — Telehealth (INDEPENDENT_AMBULATORY_CARE_PROVIDER_SITE_OTHER): Payer: 59 | Admitting: Obstetrics and Gynecology

## 2022-12-01 DIAGNOSIS — Z09 Encounter for follow-up examination after completed treatment for conditions other than malignant neoplasm: Secondary | ICD-10-CM

## 2022-12-01 NOTE — Progress Notes (Signed)
GYNECOLOGY VIRTUAL VISIT ENCOUNTER NOTE  Provider location: Center for [facility]   Patient location: Home  I connected with Samantha Francis on 12/01/22 at  4:15 PM EDT by MyChart Video Encounter and verified that I am speaking with the correct person using two identifiers.   I discussed the limitations, risks, security and privacy concerns of performing an evaluation and management service virtually and the availability of in person appointments. I also discussed with the patient that there may be a patient responsible charge related to this service. The patient expressed understanding and agreed to proceed.   History:  Samantha Francis is a 42 y.o. [phone] female being evaluated today for postop. No vaginal bleeding, minimal use of pain medication. Able to have BM without issues and passing flatus. No fever, chills, nause aor ovmiting  Incisions are healing well.    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 Pt had epidural.   Past Surgical History:  Procedure Laterality Date   CESAREAN SECTION  2009   CESAREAN SECTION N/A 06/10/2014   Procedure: CESAREAN SECTION;  Surgeon: Philip Aspen, DO;  Location: [facility];  Service: Obstetrics;  Laterality: N/A;   CESAREAN SECTION N/A 04/20/2016   Procedure: CESAREAN SECTION;  Surgeon: Reva Bores, MD;  Location: [facility] SU[facility];  Service: Obstetrics;  Laterality: N/A;   CYSTOSCOPY N/A 11/02/2022   Procedure: CYSTOSCOPY;  Surgeon: Lorriane Shire, MD;  Location: [facility] OR;  Service: Gynecology;  Laterality: N/A;   IUD REMOVAL  11/02/2022   Procedure: INTRAUTERINE DEVICE (IUD) REMOVAL;  Surgeon: Lorriane Shire, MD;  Location: [facility] OR;  Service: Gynecology;;   MYOMECTOMY     ROBOTIC ASSISTED TOTAL HYSTERECTOMY Bilateral 11/02/2022   Procedure: XI ROBOTIC ASSISTED TOTAL HYSTERECTOMY WITH BILATERAL SALPINGECTOMY/FILSHE CLIPS;  Surgeon: Lorriane Shire, MD;  Location: [facility] OR;  Service: Gynecology;  Laterality: Bilateral;   TUBAL LIGATION Bilateral 04/20/2016   Procedure: BILATERAL TUBAL LIGATION;  Surgeon: Reva Bores, MD;  Location: [facility] BIRTHING SUITES;  Service: Obstetrics;  Laterality: Bilateral;   XI ROBOTIC ASSISTED VENTRAL HERNIA N/A 07/20/2021   Procedure: XI ROBOTIC ASSISTED VENTRAL HERNIA WITH MESH;  Surgeon: Henrene Dodge, MD;  Location: [facility] ORS;  Service: General;  Laterality: N/A;   The following portions of the patient's history were reviewed and updated as appropriate: allergies, current medications, past family history, past medical history, past social history, past surgical history and problem list.    Review of Systems:  Pertinent items noted in HPI and remainder of comprehensive ROS otherwise negative.  Physical Exam:   General:  Alert, oriented and cooperative. Patient appears to be in no acute distress.  Mental Status: Normal mood and affect. Normal behavior. Normal judgment and thought content.   Respiratory: Normal respiratory effort, no problems with respiration noted  Rest of physical exam deferred due to type of encounter  Labs and Imaging No results found for this or  any previous visit (from the past 336 hour(s)). No results found.     Assessment and Plan:     1. Postop check Doing well postoperatively. Will drop FMLA paperwork off for completion - 9 weeks total. Reviewed postoperative restrictions and plan for follow up vaginal cuff check at 6-8 weeks postop.       I discussed the assessment and  treatment plan with the patient. The patient was provided an opportunity to ask questions and all were answered. The patient agreed with the plan and demonstrated an understanding of the instructions.   The patient was advised to call back or seek an in-person evaluation/go to the ED if the symptoms worsen or if the condition fails to improve as anticipated.  I provided 5 minutes of face-to-face time during this encounter.   Lorriane Shire, MD Center for [employer], [facility]

## 2022-12-20 ENCOUNTER — Telehealth: Payer: Self-pay

## 2022-12-20 NOTE — Telephone Encounter (Signed)
Patient called and spoke with after hours answering service Access Nurse. Call transferred to clinical staff. Pt reporting flu like symptoms. No concerns related to hysterectomy. Will reschedule post op. Provider notified of change in schedule.

## 2022-12-21 ENCOUNTER — Ambulatory Visit: Payer: Self-pay | Admitting: Obstetrics and Gynecology

## 2022-12-27 ENCOUNTER — Encounter: Payer: Self-pay | Admitting: Obstetrics and Gynecology

## 2022-12-27 ENCOUNTER — Ambulatory Visit (INDEPENDENT_AMBULATORY_CARE_PROVIDER_SITE_OTHER): Payer: 59 | Admitting: Obstetrics and Gynecology

## 2022-12-27 VITALS — BP 154/99 | HR 79 | Wt 205.7 lb

## 2022-12-27 DIAGNOSIS — Z09 Encounter for follow-up examination after completed treatment for conditions other than malignant neoplasm: Secondary | ICD-10-CM

## 2022-12-27 NOTE — Progress Notes (Signed)
   POSTOPERATIVE VISIT NOTE   Subjective:     Samantha Francis is a 42 y.o. [id number] who presents to the clinic 8 weeks status post  RA-TLH, BS, cysto  for abnormal uterine bleeding. Eating a regular diet without difficulty. Bowel movements are normal. The patient is not having any pain. Incision: well approximated Vaginal bleeding: none URI symptoms have resolved  The following portions of the patient's history were reviewed and updated as appropriate: allergies, current medications, past family history, past medical history, past social history, past surgical history, and problem list..   Review of Systems Pertinent items are noted in HPI.    Objective:    BP (!) 154/99   Pulse 79   Wt 205 lb 11.2 oz (93.3 kg)   LMP 10/17/2022 Comment: DOS UPERG NEGATIVE  BMI 36.73 kg/m  General:  alert, cooperative, and no distress  Abdomen: soft, bowel sounds active, non-tender  Incision:   healing well, no drainage, no erythema, no hernia, no seroma, no swelling, no dehiscence, incision well approximated  Pelvic:    Normal vaginal canal, cuff intact on visual inspection and on palpation    Pathology Results: FINAL MICROSCOPIC DIAGNOSIS:   A. UTERUS AND CERVIX, WITH BILATERAL FALLOPIAN TUBES AND TWO FILSHE  CLIPS, HYSTERECTOMY:  - Uterus with leiomyomata, largest measuring 0.3 cm  - Benign secretory endometrium  - Benign unremarkable cervix  - Benign unremarkable bilateral fallopian tubes and 2 Filshie clips  - No evidence of malignancy    Assessment:   Doing well postoperatively. Operative findings again reviewed. Pathology report discussed.   There are no diagnoses linked to this encounter.   Plan:   1. Continue any current medications. 2. Resume annual visit 3. Activity restrictions: none 4. Anticipated return to work: work Physicist, medical provided. 5. Follow up as needed   Lorriane Shire, MD Obstetrician & Gynecologist, [facility] for [employer], [facility]

## 2023-05-19 ENCOUNTER — Ambulatory Visit
Admission: EM | Admit: 2023-05-19 | Discharge: 2023-05-19 | Disposition: A | Discharge status: Home / Self Care | Payer: 59 | Attending: Internal Medicine | Admitting: Internal Medicine

## 2023-05-19 DIAGNOSIS — J069 Acute upper respiratory infection, unspecified: Secondary | ICD-10-CM | POA: Insufficient documentation

## 2023-05-19 DIAGNOSIS — Z20822 Contact with and (suspected) exposure to covid-19: Secondary | ICD-10-CM | POA: Insufficient documentation

## 2023-05-19 MED ORDER — FLUTICASONE PROPIONATE 50 MCG/ACT NA SUSP: 1.0000 | Freq: Every day | NASAL | 0 refills | Status: AC

## 2023-05-19 MED ORDER — BENZONATATE 100 MG PO CAPS: 100.0000 mg | ORAL_CAPSULE | Freq: Three times a day (TID) | ORAL | 0 refills | Status: AC | PRN

## 2023-05-19 NOTE — Discharge Instructions (Signed)
COVID test is pending.  We will call if it is positive.  Suspect viral cause of symptoms as we discussed.  I have prescribed you 2 medications to help alleviate symptoms.  Please follow-up if any symptoms persist or worsen.

## 2023-05-19 NOTE — ED Provider Notes (Signed)
[name]    CSN: [id number] Arrival date & time: 05/19/23  1812      History   Chief Complaint Chief Complaint  Patient presents with   Cough    HPI Samantha Francis is a 42 y.o. female.   Patient presents with cough, fever, chills, nasal congestion, sweats, body aches, fatigue that started this morning.  Reports that she was exposed to COVID-19 at her workplace.  She has taken 3 home COVID tests that were all negative.  Has taken DayQuil for symptoms.  Reports history of asthma but has not needed albuterol inhaler.  Patient is not sure of Tmax at home.   Cough   Past Medical History:  Diagnosis Date   Allergic rhinitis    Allergy    Anemia    not with current pregnancy   Asthma    childhood but never used an inhaler   GERD (gastroesophageal reflux disease)    with pregnancy   History of uterine fibroid    HSV (herpes simplex virus) anogenital infection    PONV (postoperative nausea and vomiting)    Spinal headache 2009   With delivery of daughter. Pt had epidural.    Patient Active Problem List   Diagnosis Date Noted   Abnormal uterine bleeding (AUB) 11/02/2022   Abnormal uterine bleeding 11/02/2022   HSV-2 infection 06/02/2021   Ventral hernia without obstruction or gangrene 06/02/2021   Obesity (BMI 35.0-39.9 without comorbidity) 07/15/2019   IUD (intrauterine device) in place 07/14/2019   Iron deficiency anemia due to chronic blood loss 07/13/2018   Family history of ovarian cancer 05/17/2018   Adenomyosis of uterus 05/17/2018   Lateral epicondylitis of left elbow 04/19/2017   Left elbow pain 04/19/2017   Genital HSV 02/29/2016   Sebaceous cyst 02/03/2015    Past Surgical History:  Procedure Laterality Date   CESAREAN SECTION  2009   CESAREAN SECTION N/A 06/10/2014   Procedure: CESAREAN SECTION;  Surgeon: Philip Aspen, DO;  Location: [facility];  Service: Obstetrics;  Laterality: N/A;   CESAREAN SECTION N/A 04/20/2016    Procedure: CESAREAN SECTION;  Surgeon: Reva Bores, MD;  Location: [facility];  Service: Obstetrics;  Laterality: N/A;   CYSTOSCOPY N/A 11/02/2022   Procedure: CYSTOSCOPY;  Surgeon: Lorriane Shire, MD;  Location: [facility] OR;  Service: Gynecology;  Laterality: N/A;   IUD REMOVAL  11/02/2022   Procedure: INTRAUTERINE DEVICE (IUD) REMOVAL;  Surgeon: Lorriane Shire, MD;  Location: [facility];  Service: Gynecology;;   MYOMECTOMY     ROBOTIC ASSISTED TOTAL HYSTERECTOMY Bilateral 11/02/2022   Procedure: XI ROBOTIC ASSISTED TOTAL HYSTERECTOMY WITH BILATERAL SALPINGECTOMY/FILSHE CLIPS;  Surgeon: Lorriane Shire, MD;  Location: [facility];  Service: Gynecology;  Laterality: Bilateral;   TUBAL LIGATION Bilateral 04/20/2016   Procedure: BILATERAL TUBAL LIGATION;  Surgeon: Reva Bores, MD;  Location: [facility] BIRTHING SUITES;  Service: Obstetrics;  Laterality: Bilateral;   XI ROBOTIC ASSISTED VENTRAL HERNIA N/A 07/20/2021   Procedure: XI ROBOTIC ASSISTED VENTRAL HERNIA WITH MESH;  Surgeon: Henrene Dodge, MD;  Location: [facility] ORS;  Service: General;  Laterality: N/A;    OB History     Gravida  4   Para  3   Term  2   Preterm      AB  1   Living  3      SAB  1   IAB      Ectopic      Multiple  0   Live Births  3  Home Medications    Prior to Admission medications   Medication Sig Start Date End Date Taking? Authorizing Provider  benzonatate (TESSALON) 100 MG capsule Take 1 capsule (100 mg total) by mouth every 8 (eight) hours as needed for cough. 05/19/23  Yes Chayanne Speir, Rolly Salter E, FNP  fluticasone (FLONASE) 50 MCG/ACT nasal spray Place 1 spray into both nostrils daily. 05/19/23  Yes Gustavus Bryant, FNP    Family History Family History  Problem Relation Age of Onset   Hyperlipidemia Maternal Grandfather    Hypertension Maternal Grandfather    Hypertension Father    Hyperlipidemia Mother    Diabetes Sister    Hyperlipidemia Sister    Ovarian cancer Maternal Aunt     Diabetes Maternal Aunt    Breast cancer Cousin 74    Social History Social History   Tobacco Use   Smoking status: Never   Smokeless tobacco: Never  Vaping Use   Vaping status: Never Used  Substance Use Topics   Alcohol use: Yes    Comment: Rarely   Drug use: No     Allergies   Doxycycline   Review of Systems Review of Systems Per HPI  Physical Exam Triage Vital Signs ED Triage Vitals  Encounter Vitals Group     BP 05/19/23 1850 (!) 142/87     Systolic BP Percentile --      Diastolic BP Percentile --      Pulse Rate 05/19/23 1850 98     Resp 05/19/23 1850 16     Temp 05/19/23 1850 99.8 F (37.7 C)     Temp Source 05/19/23 1850 Oral     SpO2 05/19/23 1850 97 %     Weight 05/19/23 1850 200 lb (90.7 kg)     Height 05/19/23 1850 5' 2.75" (1.594 m)     Head Circumference --      Peak Flow --      Pain Score 05/19/23 1849 3     Pain Loc --      Pain Education --      Exclude from Growth Chart --    No data found.  Updated Vital Signs BP (!) 142/87 (BP Location: Left Arm)   Pulse 98   Temp 99.8 F (37.7 C) (Oral)   Resp 16   Ht 5' 2.75" (1.594 m)   Wt 200 lb (90.7 kg)   LMP 10/17/2022 Comment: DOS UPERG NEGATIVE  SpO2 97%   BMI 35.71 kg/m   Visual Acuity Right Eye Distance:   Left Eye Distance:   Bilateral Distance:    Right Eye Near:   Left Eye Near:    Bilateral Near:     Physical Exam Constitutional:      General: She is not in acute distress.    Appearance: Normal appearance. She is not toxic-appearing or diaphoretic.  HENT:     Head: Normocephalic and atraumatic.     Right Ear: Tympanic membrane and ear canal normal.     Left Ear: Tympanic membrane and ear canal normal.     Nose: Congestion present.     Mouth/Throat:     Mouth: Mucous membranes are moist.     Pharynx: Posterior oropharyngeal erythema present.  Eyes:     Extraocular Movements: Extraocular movements intact.     Conjunctiva/sclera: Conjunctivae normal.     Pupils:  Pupils are equal, round, and reactive to light.  Cardiovascular:     Rate and Rhythm: Normal rate and regular rhythm.  Pulses: Normal pulses.     Heart sounds: Normal heart sounds.  Pulmonary:     Effort: Pulmonary effort is normal. No respiratory distress.     Breath sounds: Normal breath sounds. No stridor. No wheezing, rhonchi or rales.  Abdominal:     General: Abdomen is flat. Bowel sounds are normal.     Palpations: Abdomen is soft.  Musculoskeletal:        General: Normal range of motion.     Cervical back: Normal range of motion.  Skin:    General: Skin is warm and dry.  Neurological:     General: No focal deficit present.     Mental Status: She is alert and oriented to person, place, and time. Mental status is at baseline.  Psychiatric:        Mood and Affect: Mood normal.        Behavior: Behavior normal.      UC Treatments / Results  Labs (all labs ordered are listed, but only abnormal results are displayed) Labs Reviewed  SARS CORONAVIRUS 2 (TAT 6-24 HRS)    EKG   Radiology No results found.  Procedures Procedures (including critical care time)  Medications Ordered in UC Medications - No data to display  Initial Impression / Assessment and Plan / UC Course  I have reviewed the triage vital signs and the nursing notes.  Pertinent labs & imaging results that were available during my care of the patient were reviewed by me and considered in my medical decision making (see chart for details).     Patient presents with symptoms likely from a viral upper respiratory infection. Do not suspect underlying cardiopulmonary process. Symptoms seem unlikely related to ACS, CHF or COPD exacerbations, pneumonia, pneumothorax. Patient is nontoxic appearing and not in need of emergent medical intervention.  I am most suspicious of COVID-19 given close exposure despite negative rapid COVID tests so will send COVID PCR.  Recommended symptom control with medications and  supportive care.  Return if symptoms fail to improve in 1-2 weeks or you develop shortness of breath, chest pain, severe headache. Patient states understanding and is agreeable.  Discharged with PCP followup.  Final Clinical Impressions(s) / UC Diagnoses   Final diagnoses:  Viral upper respiratory tract infection with cough  Close exposure to COVID-19 virus     Discharge Instructions      COVID test is pending.  We will call if it is positive.  Suspect viral cause of symptoms as we discussed.  I have prescribed you 2 medications to help alleviate symptoms.  Please follow-up if any symptoms persist or worsen.    ED Prescriptions     Medication Sig Dispense Auth. Provider   fluticasone (FLONASE) 50 MCG/ACT nasal spray Place 1 spray into both nostrils daily. 16 g Bailyn Spackman, Rolly Salter E, [place]   benzonatate (TESSALON) 100 MG capsule Take 1 capsule (100 mg total) by mouth every 8 (eight) hours as needed for cough. 21 capsule [place], Acie Fredrickson, [place]      PDMP not reviewed this encounter.   Gustavus Bryant, [place] 05/19/23 [phone]

## 2023-05-19 NOTE — ED Triage Notes (Signed)
Patient here today with c/o productive cough, fever, chills, congestion, chills, sweats, body aches, nasal drainage, and fatigue that started this morning. She took Dayquil with no relief. Patient states that her coworker told her that she tested positive for Covid. Patient took 3 home test and all were negative.

## 2023-05-20 LAB — SARS CORONAVIRUS 2 (TAT 6-24 HRS): SARS Coronavirus 2: NEGATIVE

## 2023-12-11 ENCOUNTER — Ambulatory Visit
Admission: RE | Admit: 2023-12-11 | Discharge: 2023-12-11 | Disposition: A | Discharge status: Home / Self Care | Source: Ambulatory Visit

## 2023-12-11 ENCOUNTER — Ambulatory Visit (INDEPENDENT_AMBULATORY_CARE_PROVIDER_SITE_OTHER)

## 2023-12-11 VITALS — BP 146/94 | HR 67 | Temp 98.0°F | Resp 18

## 2023-12-11 DIAGNOSIS — M5412 Radiculopathy, cervical region: Secondary | ICD-10-CM

## 2023-12-11 DIAGNOSIS — O09529 Supervision of elderly multigravida, unspecified trimester: Secondary | ICD-10-CM | POA: Insufficient documentation

## 2023-12-11 DIAGNOSIS — B009 Herpesviral infection, unspecified: Secondary | ICD-10-CM | POA: Insufficient documentation

## 2023-12-11 DIAGNOSIS — D259 Leiomyoma of uterus, unspecified: Secondary | ICD-10-CM | POA: Insufficient documentation

## 2023-12-11 MED ORDER — TIZANIDINE HCL 4 MG PO TABS: 4.0000 mg | ORAL_TABLET | Freq: Three times a day (TID) | ORAL | 0 refills | Status: AC | PRN

## 2023-12-11 MED ORDER — PREDNISONE 20 MG PO TABS: 20.0000 mg | ORAL_TABLET | Freq: Two times a day (BID) | ORAL | 0 refills | Status: AC

## 2023-12-11 NOTE — Discharge Instructions (Addendum)
 If neck pain symptoms have not improved with completion of prednisone or if any point they worsen I would like for you to one of orthopedic/sports medicine walk-in clinics attached to your paperwork.  Continue to apply heat as needed for pain management.  Take any ibuprofen or naproxen while taking prednisone however you can take Tylenol.

## 2023-12-11 NOTE — ED Provider Notes (Signed)
 [name]    CSN: [id number] Arrival date & time: 12/11/23  1833      History   Chief Complaint Chief Complaint  Patient presents with   Joint Pain    I have experienced pain on the back of my right shoulder blade area up to the top of my shoulder since 12/06/2023. - Entered by patient    HPI Samantha Francis is a 43 y.o. female.   HPI Patient presents for evaluation of back and right shoulder blade pain radiating to tip of right shoulder x 12/06/2023. No injury. Patient with rotating neck.  Symptoms present nearly 2 weeks. No prior hx of neck or shoulder injuries or recurrent pain. She has taken OTC medication without any marked improvement of pain.  Past Medical History:  Diagnosis Date   Allergic rhinitis    Allergy    Anemia    not with current pregnancy   Asthma    childhood but never used an inhaler   GERD (gastroesophageal reflux disease)    with pregnancy   History of uterine fibroid    HSV (herpes simplex virus) anogenital infection    PONV (postoperative nausea and vomiting)    Spinal headache 2009   With delivery of daughter. Pt had epidural.    Patient Active Problem List   Diagnosis Date Noted   Herpesvirus infection 12/11/2023   Uterine leiomyoma 12/11/2023   Maternal age 35+, multigravida, antepartum 12/11/2023   Abnormal uterine bleeding (AUB) 11/02/2022   Abnormal uterine bleeding 11/02/2022   HSV-2 infection 06/02/2021   Ventral hernia without obstruction or gangrene 06/02/2021   Obesity (BMI 35.0-39.9 without comorbidity) 07/15/2019   IUD (intrauterine device) in place 07/14/2019   Iron deficiency anemia due to chronic blood loss 07/13/2018   Family history of ovarian cancer 05/17/2018   Adenomyosis of uterus 05/17/2018   Lateral epicondylitis of left elbow 04/19/2017   Left elbow pain 04/19/2017   Genital HSV 02/29/2016   Sebaceous cyst 02/03/2015    Past Surgical History:  Procedure Laterality Date   CESAREAN  SECTION  2009   CESAREAN SECTION N/A 06/10/2014   Procedure: CESAREAN SECTION;  Surgeon: Atlas Blank, DO;  Location: [facility];  Service: Obstetrics;  Laterality: N/A;   CESAREAN SECTION N/A 04/20/2016   Procedure: CESAREAN SECTION;  Surgeon: Granville Layer, MD;  Location: [facility]TH[facility] SUIT[facility];  Service: Obstetrics;  Laterality: N/A;   CYSTOSCOPY N/A 11/02/2022   Procedure: CYSTOSCOPY;  Surgeon: Kiki Pelton, MD;  Location: [facility] OR;  Service: Gynecology;  Laterality: N/A;   IUD REMOVAL  11/02/2022   Procedure: INTRAUTERINE DEVICE (IUD) REMOVAL;  Surgeon: Kiki Pelton, MD;  Location: [facility];  Service: Gynecology;;   MYOMECTOMY     ROBOTIC ASSISTED TOTAL HYSTERECTOMY Bilateral 11/02/2022   Procedure: XI ROBOTIC ASSISTED TOTAL HYSTERECTOMY WITH BILATERAL SALPINGECTOMY/FILSHE CLIPS;  Surgeon: Kiki Pelton, MD;  Location: [facility];  Service: Gynecology;  Laterality: Bilateral;   TUBAL LIGATION Bilateral 04/20/2016   Procedure: BILATERAL TUBAL LIGATION;  Surgeon: Granville Layer, MD;  Location: [facility];  Service: Obstetrics;  Laterality: Bilateral;   XI ROBOTIC ASSISTED VENTRAL HERNIA N/A 07/20/2021   Procedure: XI ROBOTIC ASSISTED VENTRAL HERNIA WITH MESH;  Surgeon: Emmalene Hare, MD;  Location: [facility] OR[facility];  Service: General;  Laterality: N/A;    OB History     Gravida  4   Para  3   Term  2   Preterm      AB  1   Living  3      SAB  1   IAB      Ectopic      Multiple  0   Live Births  3            Home Medications    Prior to Admission medications   Medication Sig Start Date End Date Taking? Authorizing Provider  naproxen  (NAPROSYN ) 250 MG tablet Take by mouth 2 (two) times daily with a meal.   Yes [provider]  predniSONE  (DELTASONE ) 20 MG tablet Take 1 tablet (20 mg total) by mouth 2 (two) times daily with a meal for 5 days. 12/11/23 12/16/23 Yes Buena Carmine, NP  tiZANidine  (ZANAFLEX ) 4 MG tablet Take 1 tablet (4 mg total) by mouth every  8 (eight) hours as needed (muscular pain). 12/11/23  Yes Buena Carmine, NP  benzonatate  (TESSALON ) 100 MG capsule Take 1 capsule (100 mg total) by mouth every 8 (eight) hours as needed for cough. 05/19/23   Dodson Freestone, FNP  fluticasone  (FLONASE ) 50 MCG/ACT nasal spray Place 1 spray into both nostrils daily. 05/19/23   Dodson Freestone, FNP    Family History Family History  Problem Relation Age of Onset   Hyperlipidemia Maternal Grandfather    Hypertension Maternal Grandfather    Hypertension Father    Hyperlipidemia Mother    Diabetes Sister    Hyperlipidemia Sister    Ovarian cancer Maternal Aunt    Diabetes Maternal Aunt    Breast cancer Cousin 87    Social History Social History   Tobacco Use   Smoking status: Never   Smokeless tobacco: Never  Vaping Use   Vaping status: Never Used  Substance Use Topics   Alcohol use: Yes    Comment: Rarely   Drug use: No     Allergies   Doxycycline   Review of Systems Review of Systems  Musculoskeletal:  Positive for arthralgias, neck pain and neck stiffness.     Physical Exam Triage Vital Signs ED Triage Vitals  Encounter Vitals Group     BP 12/11/23 1850 (!) 146/94     Systolic BP Percentile --      Diastolic BP Percentile --      Pulse Rate 12/11/23 1850 67     Resp 12/11/23 1850 18     Temp 12/11/23 1850 98 F (36.7 C)     Temp Source 12/11/23 1850 Oral     SpO2 12/11/23 1850 97 %     Weight --      Height --      Head Circumference --      Peak Flow --      Pain Score 12/11/23 1848 5     Pain Loc --      Pain Education --      Exclude from Growth Chart --    No data found.  Updated Vital Signs BP (!) 146/94 (BP Location: Left Arm)   Pulse 67   Temp 98 F (36.7 C) (Oral)   Resp 18   LMP 10/17/2022 Comment: DOS UPERG NEGATIVE  SpO2 97%   Visual Acuity Right Eye Distance:   Left Eye Distance:   Bilateral Distance:    Right Eye Near:   Left Eye Near:    Bilateral Near:     Physical  Exam Vitals reviewed.  Constitutional:      Appearance: Normal appearance.  HENT:     Head: Normocephalic and atraumatic.     Nose: No  congestion or rhinorrhea.  Eyes:     Extraocular Movements: Extraocular movements intact.     Conjunctiva/sclera: Conjunctivae normal.     Pupils: Pupils are equal, round, and reactive to light.  Cardiovascular:     Rate and Rhythm: Normal rate and regular rhythm.  Pulmonary:     Effort: Pulmonary effort is normal.     Breath sounds: Normal breath sounds.  Musculoskeletal:     Cervical back: Rigidity and tenderness present. Pain with movement, spinous process tenderness and muscular tenderness present. Decreased range of motion.  Neurological:     General: No focal deficit present.     Mental Status: She is alert and oriented to person, place, and time.      UC Treatments / Results  Labs (all labs ordered are listed, but only abnormal results are displayed) Labs Reviewed - No data to display  EKG   Radiology No results found.  Procedures Procedures (including critical care time)  Medications Ordered in UC Medications - No data to display  Initial Impression / Assessment and Plan / UC Course  I have reviewed the triage vital signs and the nursing notes.  Pertinent labs & imaging results that were available during my care of the patient were reviewed by me and considered in my medical decision making (see chart for details).    1. Cervical radiculitis (Primary) - DG Cervical Spine Complete - predniSONE  (DELTASONE ) 20 MG tablet; Take 1 tablet (20 mg total) by mouth 2 (two) times daily with a meal for 5 days.  Dispense: 10 tablet; Refill: 0 - tiZANidine  (ZANAFLEX ) 4 MG tablet; Take 1 tablet (4 mg total) by mouth every 8 (eight) hours as needed (muscular pain).  Dispense: 30 tablet; Refill: 0    Imaging of the cervical spine indicate chronic disease involving the cervical spine.  Patient advised if treatment fails to follow-up with  orthopedic specialist.  Final Clinical Impressions(s) / UC Diagnoses   Final diagnoses:  Cervical radiculitis     Discharge Instructions      If neck pain symptoms have not improved with completion of prednisone  or if any point they worsen I would like for you to one of orthopedic/sports medicine walk-in clinics attached to your paperwork.  Continue to apply heat as needed for pain management.  Take any ibuprofen  or naproxen  while taking prednisone  however you can take Tylenol .     ED Prescriptions     Medication Sig Dispense Auth. Provider   predniSONE  (DELTASONE ) 20 MG tablet Take 1 tablet (20 mg total) by mouth 2 (two) times daily with a meal for 5 days. 10 tablet Buena Carmine, NP   tiZANidine  (ZANAFLEX ) 4 MG tablet Take 1 tablet (4 mg total) by mouth every 8 (eight) hours as needed (muscular pain). 30 tablet Buena Carmine, NP      PDMP not reviewed this encounter.   Buena Carmine, NP 12/16/23 [phone]

## 2023-12-11 NOTE — ED Triage Notes (Signed)
 I have experienced pain on the back of my right shoulder blade area up to the top of my shoulder since 12/06/2023. - Entered by patient  Pt states the pain radiates to the middle of her back. Pt denies injury and/or fall.

## 2023-12-12 ENCOUNTER — Encounter: Payer: Self-pay | Admitting: Family Medicine

## 2024-01-24 ENCOUNTER — Ambulatory Visit
Admission: RE | Admit: 2024-01-24 | Discharge: 2024-01-24 | Disposition: A | Discharge status: Home / Self Care | Payer: Self-pay | Source: Ambulatory Visit | Attending: Physician Assistant | Admitting: Physician Assistant

## 2024-01-24 VITALS — BP 134/89 | HR 86 | Temp 98.4°F | Resp 16

## 2024-01-24 DIAGNOSIS — Z113 Encounter for screening for infections with a predominantly sexual mode of transmission: Secondary | ICD-10-CM | POA: Diagnosis not present

## 2024-01-24 NOTE — ED Triage Notes (Addendum)
 Pt here requesting testing for STDs. States she has reconciled with past partner and is worried about exposure. Had vaginal discharge this weekend. Self treated for yeast with some relief but is still concerned.

## 2024-01-24 NOTE — ED Provider Notes (Signed)
 [name]    CSN: [id number] Arrival date & time: 01/24/24  1703      History   Chief Complaint Chief Complaint  Patient presents with   Exposure to STD    HPI Samantha DRENNEN is a 43 y.o. female.   Patient here today for STD screening.  She reports she has had some mild vaginal discharge that did improve after treatment for yeast vaginitis at home however she has reconciled with previous partner and would like to ensure no exposures.  She does request blood work as well.  The history is provided by the patient.  Exposure to STD Pertinent negatives include no abdominal pain and no shortness of breath.    Past Medical History:  Diagnosis Date   Allergic rhinitis    Allergy    Anemia    not with current pregnancy   Asthma    childhood but never used an inhaler   GERD (gastroesophageal reflux disease)    with pregnancy   History of uterine fibroid    HSV (herpes simplex virus) anogenital infection    PONV (postoperative nausea and vomiting)    Spinal headache 2009   With delivery of daughter. Pt had epidural.    Patient Active Problem List   Diagnosis Date Noted   Herpesvirus infection 12/11/2023   Uterine leiomyoma 12/11/2023   Maternal age 85+, multigravida, antepartum 12/11/2023   Abnormal uterine bleeding (AUB) 11/02/2022   Abnormal uterine bleeding 11/02/2022   HSV-2 infection 06/02/2021   Ventral hernia without obstruction or gangrene 06/02/2021   Obesity (BMI 35.0-39.9 without comorbidity) 07/15/2019   IUD (intrauterine device) in place 07/14/2019   Iron deficiency anemia due to chronic blood loss 07/13/2018   Family history of ovarian cancer 05/17/2018   Adenomyosis of uterus 05/17/2018   Lateral epicondylitis of left elbow 04/19/2017   Left elbow pain 04/19/2017   Genital HSV 02/29/2016   Sebaceous cyst 02/03/2015    Past Surgical History:  Procedure Laterality Date   CESAREAN SECTION  2009   CESAREAN SECTION N/A  06/10/2014   Procedure: CESAREAN SECTION;  Surgeon: Atlas Blank, DO;  Location: [facility];  Service: Obstetrics;  Laterality: N/A;   CESAREAN SECTION N/A 04/20/2016   Procedure: CESAREAN SECTION;  Surgeon: Granville Layer, MD;  Location: [facility] BIRTHING SUITES;  Service: Obstetrics;  Laterality: N/A;   CYSTOSCOPY N/A 11/02/2022   Procedure: CYSTOSCOPY;  Surgeon: Kiki Pelton, MD;  Location: [facility] OR;  Service: Gynecology;  Laterality: N/A;   IUD REMOVAL  11/02/2022   Procedure: INTRAUTERINE DEVICE (IUD) REMOVAL;  Surgeon: Kiki Pelton, MD;  Location: [facility];  Service: Gynecology;;   MYOMECTOMY     ROBOTIC ASSISTED TOTAL HYSTERECTOMY Bilateral 11/02/2022   Procedure: XI ROBOTIC ASSISTED TOTAL HYSTERECTOMY WITH BILATERAL SALPINGECTOMY/FILSHE CLIPS;  Surgeon: Kiki Pelton, MD;  Location: [facility];  Service: Gynecology;  Laterality: Bilateral;   TUBAL LIGATION Bilateral 04/20/2016   Procedure: BILATERAL TUBAL LIGATION;  Surgeon: Granville Layer, MD;  Location: [facility] BIRTHING SUITES;  Service: Obstetrics;  Laterality: Bilateral;   XI ROBOTIC ASSISTED VENTRAL HERNIA N/A 07/20/2021   Procedure: XI ROBOTIC ASSISTED VENTRAL HERNIA WITH MESH;  Surgeon: Emmalene Hare, MD;  Location: [facility] ORS;  Service: General;  Laterality: N/A;    OB History     Gravida  4   Para  3   Term  2   Preterm      AB  1   Living  3      SAB  1   IAB      Ectopic      Multiple  0   Live Births  3            Home Medications    Prior to Admission medications   Medication Sig Start Date End Date Taking? Authorizing Provider  methocarbamol (ROBAXIN) 500 MG tablet Take 500 mg by mouth every 8 (eight) hours as needed. 01/24/24  Yes [provider]  benzonatate  (TESSALON ) 100 MG capsule Take 1 capsule (100 mg total) by mouth every 8 (eight) hours as needed for cough. Patient not taking: Reported on 01/24/2024 05/19/23   Dodson Freestone, FNP  fluticasone  (FLONASE ) 50 MCG/ACT nasal spray Place 1 spray  into both nostrils daily. 05/19/23  Yes [place], Fairy Homer E, FNP  naproxen  (NAPROSYN ) 250 MG tablet Take by mouth 2 (two) times daily with a meal.   Yes [provider]  tiZANidine  (ZANAFLEX ) 4 MG tablet Take 1 tablet (4 mg total) by mouth every 8 (eight) hours as needed (muscular pain). 12/11/23  Yes Buena Carmine, NP    Family History Family History  Problem Relation Age of Onset   Hyperlipidemia Maternal Grandfather    Hypertension Maternal Grandfather    Hypertension Father    Hyperlipidemia Mother    Diabetes Sister    Hyperlipidemia Sister    Ovarian cancer Maternal Aunt    Diabetes Maternal Aunt    Breast cancer Cousin 23    Social History Social History   Tobacco Use   Smoking status: Never   Smokeless tobacco: Never  Vaping Use   Vaping status: Never Used  Substance Use Topics   Alcohol use: Yes    Comment: Rarely   Drug use: No     Allergies   Doxycycline   Review of Systems Review of Systems  Constitutional:  Negative for chills and fever.  Eyes:  Negative for discharge and redness.  Respiratory:  Negative for shortness of breath.   Gastrointestinal:  Negative for abdominal pain, nausea and vomiting.  Genitourinary:  Positive for vaginal discharge. Negative for genital sores.     Physical Exam Triage Vital Signs ED Triage Vitals  Encounter Vitals Group     BP      Systolic BP Percentile      Diastolic BP Percentile      Pulse      Resp      Temp      Temp src      SpO2      Weight      Height      Head Circumference      Peak Flow      Pain Score      Pain Loc      Pain Education      Exclude from Growth Chart    No data found.  Updated Vital Signs BP 134/89 (BP Location: Left Arm)   Pulse 86   Temp 98.4 F (36.9 C) (Oral)   Resp 16   LMP 10/17/2022 Comment: DOS UPERG NEGATIVE  SpO2 98%   Visual Acuity Right Eye Distance:   Left Eye Distance:   Bilateral Distance:    Right Eye Near:   Left Eye Near:    Bilateral  Near:     Physical Exam Vitals and nursing note reviewed.  Constitutional:      General: She is not in acute distress.    Appearance: Normal appearance. She is not ill-appearing.  HENT:  Head: Normocephalic and atraumatic.  Eyes:     Conjunctiva/sclera: Conjunctivae normal.  Cardiovascular:     Rate and Rhythm: Normal rate.  Pulmonary:     Effort: Pulmonary effort is normal. No respiratory distress.  Neurological:     Mental Status: She is alert.  Psychiatric:        Mood and Affect: Mood normal.        Behavior: Behavior normal.        Thought Content: Thought content normal.      UC Treatments / Results  Labs (all labs ordered are listed, but only abnormal results are displayed) Labs Reviewed  RPR  HIV ANTIBODY (ROUTINE TESTING W REFLEX)  CERVICOVAGINAL ANCILLARY ONLY    EKG   Radiology No results found.  Procedures Procedures (including critical care time)  Medications Ordered in UC Medications - No data to display  Initial Impression / Assessment and Plan / UC Course  I have reviewed the triage vital signs and the nursing notes.  Pertinent labs & imaging results that were available during my care of the patient were reviewed by me and considered in my medical decision making (see chart for details).    STD screening ordered.  Will await results further recommendation.  Encouraged follow-up with any further concerns.  Final Clinical Impressions(s) / UC Diagnoses   Final diagnoses:  Screening for STD (sexually transmitted disease)   Discharge Instructions   None    ED Prescriptions   None    PDMP not reviewed this encounter.   Vernestine Gondola, PA-C 01/24/24 [phone]

## 2024-01-25 LAB — CERVICOVAGINAL ANCILLARY ONLY
Bacterial Vaginitis (gardnerella): POSITIVE — AB
Candida Glabrata: NEGATIVE
Candida Vaginitis: NEGATIVE
Chlamydia: NEGATIVE
Comment: NEGATIVE
Comment: NEGATIVE
Comment: NEGATIVE
Comment: NEGATIVE
Comment: NEGATIVE
Comment: NORMAL
Neisseria Gonorrhea: NEGATIVE
Trichomonas: NEGATIVE

## 2024-01-25 LAB — HIV ANTIBODY (ROUTINE TESTING W REFLEX): HIV Screen 4th Generation wRfx: NONREACTIVE

## 2024-01-25 LAB — RPR: RPR Ser Ql: NONREACTIVE

## 2024-01-26 ENCOUNTER — Ambulatory Visit: Payer: Self-pay

## 2024-01-26 MED ORDER — METRONIDAZOLE 500 MG PO TABS: 500.0000 mg | ORAL_TABLET | Freq: Two times a day (BID) | ORAL | 0 refills | Status: AC

## 2024-05-26 ENCOUNTER — Ambulatory Visit

## 2024-05-26 ENCOUNTER — Emergency Department (HOSPITAL_COMMUNITY)

## 2024-05-26 ENCOUNTER — Emergency Department (HOSPITAL_COMMUNITY)
Admission: EM | Admit: 2024-05-26 | Discharge: 2024-05-26 | Disposition: A | Discharge status: Home / Self Care | Attending: Emergency Medicine | Admitting: Emergency Medicine

## 2024-05-26 ENCOUNTER — Other Ambulatory Visit: Payer: Self-pay

## 2024-05-26 ENCOUNTER — Ambulatory Visit (INDEPENDENT_AMBULATORY_CARE_PROVIDER_SITE_OTHER)

## 2024-05-26 ENCOUNTER — Encounter: Payer: Self-pay | Admitting: *Deleted

## 2024-05-26 ENCOUNTER — Ambulatory Visit
Admission: EM | Admit: 2024-05-26 | Discharge: 2024-05-26 | Disposition: A | Discharge status: Home / Self Care | Attending: Family Medicine | Admitting: Family Medicine

## 2024-05-26 DIAGNOSIS — R0602 Shortness of breath: Secondary | ICD-10-CM | POA: Diagnosis present

## 2024-05-26 DIAGNOSIS — J810 Acute pulmonary edema: Secondary | ICD-10-CM | POA: Diagnosis not present

## 2024-05-26 DIAGNOSIS — Z7951 Long term (current) use of inhaled steroids: Secondary | ICD-10-CM | POA: Diagnosis not present

## 2024-05-26 DIAGNOSIS — J4541 Moderate persistent asthma with (acute) exacerbation: Secondary | ICD-10-CM | POA: Insufficient documentation

## 2024-05-26 DIAGNOSIS — R0600 Dyspnea, unspecified: Secondary | ICD-10-CM

## 2024-05-26 LAB — CBC WITH DIFFERENTIAL/PLATELET
Abs Immature Granulocytes: 0.03 K/uL (ref 0.00–0.07)
Basophils Absolute: 0 K/uL (ref 0.0–0.1)
Basophils Relative: 0 %
Eosinophils Absolute: 0.1 K/uL (ref 0.0–0.5)
Eosinophils Relative: 1 %
HCT: 37.5 % (ref 36.0–46.0)
Hemoglobin: 11.9 g/dL — ABNORMAL LOW (ref 12.0–15.0)
Immature Granulocytes: 0 %
Lymphocytes Relative: 30 %
Lymphs Abs: 2.3 K/uL (ref 0.7–4.0)
MCH: 30.4 pg (ref 26.0–34.0)
MCHC: 31.7 g/dL (ref 30.0–36.0)
MCV: 95.9 fL (ref 80.0–100.0)
Monocytes Absolute: 0.4 K/uL (ref 0.1–1.0)
Monocytes Relative: 5 %
Neutro Abs: 4.9 K/uL (ref 1.7–7.7)
Neutrophils Relative %: 64 %
Platelets: 213 K/uL (ref 150–400)
RBC: 3.91 MIL/uL (ref 3.87–5.11)
RDW: 12.3 % (ref 11.5–15.5)
WBC: 7.7 K/uL (ref 4.0–10.5)
nRBC: 0 % (ref 0.0–0.2)

## 2024-05-26 LAB — COMPREHENSIVE METABOLIC PANEL WITH GFR
ALT: 13 U/L (ref 0–44)
AST: 15 U/L (ref 15–41)
Albumin: 3.8 g/dL (ref 3.5–5.0)
Alkaline Phosphatase: 82 U/L (ref 38–126)
Anion gap: 11 (ref 5–15)
BUN: 16 mg/dL (ref 6–20)
CO2: 23 mmol/L (ref 22–32)
Calcium: 9.1 mg/dL (ref 8.9–10.3)
Chloride: 106 mmol/L (ref 98–111)
Creatinine, Ser: 0.73 mg/dL (ref 0.44–1.00)
GFR, Estimated: >60 mL/min (ref >60)
Glucose, Bld: 103 mg/dL — ABNORMAL HIGH (ref 70–99)
Potassium: 3.8 mmol/L (ref 3.5–5.1)
Sodium: 140 mmol/L (ref 135–145)
Total Bilirubin: 0.3 mg/dL (ref 0.0–1.2)
Total Protein: 6.8 g/dL (ref 6.5–8.1)

## 2024-05-26 LAB — RESP PANEL BY RT-PCR (RSV, FLU A&B, COVID)  RVPGX2
Influenza A by PCR: NEGATIVE
Influenza B by PCR: NEGATIVE
Resp Syncytial Virus by PCR: NEGATIVE
SARS Coronavirus 2 by RT PCR: NEGATIVE

## 2024-05-26 LAB — TROPONIN T, HIGH SENSITIVITY
Troponin T High Sensitivity: <15 ng/L (ref 0–19)
Troponin T High Sensitivity: <15 ng/L (ref 0–19)

## 2024-05-26 LAB — PRO BRAIN NATRIURETIC PEPTIDE: Pro Brain Natriuretic Peptide: 129 pg/mL (ref <300.0)

## 2024-05-26 LAB — D-DIMER, QUANTITATIVE: D-Dimer, Quant: 0.56 ug{FEU}/mL — ABNORMAL HIGH (ref 0.00–0.50)

## 2024-05-26 MED ORDER — IPRATROPIUM BROMIDE 0.02 % IN SOLN
0.5000 mg | Freq: Once | RESPIRATORY_TRACT | Status: AC
  Administered 2024-05-26: 0.5 mg via RESPIRATORY_TRACT
  Filled 2024-05-26: qty 2.5

## 2024-05-26 MED ORDER — IOHEXOL 350 MG/ML SOLN
75.0000 mL | Freq: Once | INTRAVENOUS | Status: AC | PRN
  Administered 2024-05-26: 75 mL via INTRAVENOUS

## 2024-05-26 MED ORDER — ALBUTEROL SULFATE HFA 108 (90 BASE) MCG/ACT IN AERS
2.0000 | INHALATION_SPRAY | Freq: Once | RESPIRATORY_TRACT | Status: AC
  Administered 2024-05-26: 2 via RESPIRATORY_TRACT
  Filled 2024-05-26: qty 6.7

## 2024-05-26 MED ORDER — ALBUTEROL SULFATE (2.5 MG/3ML) 0.083% IN NEBU
5.0000 mg | INHALATION_SOLUTION | Freq: Once | RESPIRATORY_TRACT | Status: AC
  Administered 2024-05-26: 5 mg via RESPIRATORY_TRACT
  Filled 2024-05-26: qty 6

## 2024-05-26 MED ORDER — PREDNISONE 20 MG PO TABS: ORAL_TABLET | ORAL | 0 refills | Status: AC

## 2024-05-26 MED ORDER — METHYLPREDNISOLONE SODIUM SUCC 125 MG IJ SOLR
125.0000 mg | Freq: Once | INTRAMUSCULAR | Status: AC
  Administered 2024-05-26: 125 mg via INTRAVENOUS
  Filled 2024-05-26: qty 2

## 2024-05-26 NOTE — ED Provider Notes (Signed)
  EMERGENCY DEPARTMENT AT [facility] Provider Note   CSN: [id number] Arrival date & time: 05/26/24  1516     Patient presents with: Shortness of Breath   Samantha Francis is a 43 y.o. female history of asthma, here presenting with shortness of breath.  Patient states that she had sex yesterday.  She states that while having sex she felt very short of breath.  She stopped and then felt short of breath when she exerts herself.  Patient went to urgent care and had a chest x-ray that showed possible pulmonary edema.  Patient was sent here for further evaluation.  Patient states that she has asthma as a kid.  She has no recent asthma exacerbation.  Patient states that she usually wears mask and denies any cough or congestion.  Denies any fevers.  Denies any history of cardiac disease or recent travel.  Patient states that her son does have cardiomyopathy but she had genetic testing and apparently does not have the genes for cardiomyopathy.    The history is provided by the patient.       Prior to Admission medications   Medication Sig Start Date End Date Taking? Authorizing Provider  benzonatate  (TESSALON ) 100 MG capsule Take 1 capsule (100 mg total) by mouth every 8 (eight) hours as needed for cough. Patient not taking: Reported on 01/24/2024 05/19/23   Hazen Darryle BRAVO, FNP  fluticasone  (FLONASE ) 50 MCG/ACT nasal spray Place 1 spray into both nostrils daily. 05/19/23   Hazen Darryle BRAVO, FNP  MELOXICAM PO Take by mouth.    [provider]  methocarbamol (ROBAXIN) 500 MG tablet Take 500 mg by mouth every 8 (eight) hours as needed. 01/24/24   [provider]  naproxen  (NAPROSYN ) 250 MG tablet Take by mouth 2 (two) times daily with a meal.    [provider]  tiZANidine  (ZANAFLEX ) 4 MG tablet Take 1 tablet (4 mg total) by mouth every 8 (eight) hours as needed (muscular pain). 12/11/23   Arloa Suzen RAMAN, NP    Allergies: Doxycycline    Review  of Systems  Respiratory:  Positive for shortness of breath.   All other systems reviewed and are negative.   Updated Vital Signs BP (!) 194/118 (BP Location: Left Arm)   Pulse 60   Temp 98.6 F (37 C) (Oral)   Resp 20   LMP 10/17/2022 Comment: DOS UPERG NEGATIVE  SpO2 100%   Physical Exam Vitals reviewed.  Constitutional:      Comments: Slightly tachypneic  HENT:     Head: Normocephalic.     Mouth/Throat:     Mouth: Mucous membranes are moist.  Eyes:     Extraocular Movements: Extraocular movements intact.     Pupils: Pupils are equal, round, and reactive to light.  Cardiovascular:     Rate and Rhythm: Normal rate and regular rhythm.  Pulmonary:     Comments: Diminished bilaterally.  No obvious wheezing or crackles Musculoskeletal:        General: Normal range of motion.     Cervical back: Normal range of motion and neck supple.  Skin:    General: Skin is warm.     Capillary Refill: Capillary refill takes less than 2 seconds.  Neurological:     General: No focal deficit present.     Mental Status: She is alert and oriented to person, place, and time.  Psychiatric:        Mood and Affect: Mood normal.  Behavior: Behavior normal.     (all labs ordered are listed, but only abnormal results are displayed) Labs Reviewed  RESP PANEL BY RT-PCR (RSV, FLU A&B, COVID)  [id number]  CBC WITH DIFFERENTIAL/PLATELET  COMPREHENSIVE METABOLIC PANEL WITH GFR  PRO BRAIN NATRIURETIC PEPTIDE  D-DIMER, QUANTITATIVE  BLOOD GAS, VENOUS  TROPONIN T, HIGH SENSITIVITY    EKG: None  Radiology: DG Chest 2 View Result Date: 05/26/2024 CLINICAL DATA:  Dyspnea EXAM: CHEST - 2 VIEW COMPARISON:  None Available. FINDINGS: The cardiomediastinal silhouette is mildly enlarged in contour. No pleural effusion. No pneumothorax. Diffuse interstitial prominence with peribronchial cuffing. Visualized abdomen is unremarkable. Mild degenerative changes of the lower inner thoracic spine. IMPRESSION:  Constellation of findings are favored to reflect pulmonary edema. Differential considerations include atypical infection. Electronically Signed   By: Corean Salter M.D.   On: 05/26/2024 14:12     Procedures   Medications Ordered in the ED  albuterol (PROVENTIL) (2.5 MG/3ML) 0.083% nebulizer solution 5 mg (has no administration in time range)  ipratropium (ATROVENT) nebulizer solution 0.5 mg (has no administration in time range)  methylPREDNISolone sodium succinate (SOLU-MEDROL) 125 mg/2 mL injection 125 mg (has no administration in time range)                                    Medical Decision Making Samantha Francis is a 43 y.o. female here presenting with shortness of breath.  Consider pulmonary edema vs covid/flu vs early pneumonia vs PE versus asthma exacerbation.  Plan to get CBC and CMP and BNP and troponin and D-dimer.  Will also check for COVID and give Solu-Medrol and DuoNeb.  8:13 PM Patient's D-dimer is borderline elevated.  CTA did not show any PE or pneumonia.  COVID and flu RSV negative and troponin negative x 2.  Patient is feeling much better after 2 DuoNebs and steroids.  Likely asthma exacerbation.  Patient's able to ambulate and oxygen level is at 100%.   Problems Addressed: Moderate persistent asthma with exacerbation: acute illness or injury  Amount and/or Complexity of Data Reviewed Labs: ordered. Radiology: ordered. ECG/medicine tests: ordered.  Risk Prescription drug management.     Final diagnoses:  None    ED Discharge Orders     None          Patt Alm Macho, MD 05/26/24 2013

## 2024-05-26 NOTE — ED Triage Notes (Signed)
 Pt reports having an episode of feeling like I couldn't get enough breath, and like I was going to pass out while have sex last night. States her partner noted that it sounded like she was wheezing. Pt states she has continued to intermittently feel like I just can't get a good enough breath throughout today. Denies any chest pain. Denies any recent cough or congestion.

## 2024-05-26 NOTE — Discharge Instructions (Addendum)
  1. Acute pulmonary edema (HCC) (Primary) - EKG 12-Lead completed in UC shows normal sinus rhythm with ventricular rate of 67 bpm, normal EKG, no STEMI. - DG Chest 2 View x-ray completed in UC is suggestive of pulmonary edema versus atypical pneumonia. - Based on atypical symptoms of intermittent shortness of breath and syncopal feeling recommend follow-up in emergency department for further evaluation and treatment. - Appropriate treatment and evaluation is beyond the capabilities of urgent care due to need of advanced imaging and stat laboratory testing to determine potential causes of your symptoms. - Please go directly to emergency room after leaving urgent care for further evaluation and treatment.

## 2024-05-26 NOTE — ED Triage Notes (Addendum)
 C/o increased wob with exertion/activities starting last night. Hx of asthma in her 30's but has not experienced this since. Denies any flu like symptoms.  Seen at UC this morning with EKG and XR. XR showed either pulmonary edema or atypical infection.

## 2024-05-26 NOTE — Discharge Instructions (Addendum)
 You likely have asthma exacerbation  Please take prednisone  as prescribed  Use albuterol 2 puffs every 4 hours as needed  See your doctor for follow-up  Return to ER if you have worse shortness of breath or fever or cough

## 2024-05-26 NOTE — ED Provider Notes (Signed)
 [facility]  Note:  This document was prepared using Conservation officer, historic buildings and may include unintentional dictation errors.  MRN: [id number] DOB: 04-Mar-1981  Subjective:   Samantha Francis is a 43 y.o. female presenting for evaluation of near syncope and intermittent shortness of breath since last night.  Patient reports that while having intercourse she became very dizzy, felt like she was going to pass out and became short of breath.  Patient states that her partner told her that she sounded like she was wheezing.  Patient states that throughout the day today she has had multiple episodes of dyspnea.  Patient reports no chest pain, nasal congestion, cough, fever, chest congestion, altered mental status, loss of consciousness.  No current facility-administered medications for this encounter.  Current Outpatient Medications:    MELOXICAM PO, Take by mouth., Disp: , Rfl:    benzonatate  (TESSALON ) 100 MG capsule, Take 1 capsule (100 mg total) by mouth every 8 (eight) hours as needed for cough. (Patient not taking: Reported on 01/24/2024), Disp: 21 capsule, Rfl: 0   fluticasone  (FLONASE ) 50 MCG/ACT nasal spray, Place 1 spray into both nostrils daily., Disp: 16 g, Rfl: 0   methocarbamol (ROBAXIN) 500 MG tablet, Take 500 mg by mouth every 8 (eight) hours as needed., Disp: , Rfl:    naproxen  (NAPROSYN ) 250 MG tablet, Take by mouth 2 (two) times daily with a meal., Disp: , Rfl:    tiZANidine  (ZANAFLEX ) 4 MG tablet, Take 1 tablet (4 mg total) by mouth every 8 (eight) hours as needed (muscular pain)., Disp: 30 tablet, Rfl: 0   Allergies  Allergen Reactions   Doxycycline Nausea And Vomiting    Past Medical History:  Diagnosis Date   Allergic rhinitis    Allergy    Anemia    not with current pregnancy   Asthma    childhood but never used an inhaler   GERD (gastroesophageal reflux disease)    with pregnancy   History of uterine fibroid    HSV (herpes simplex virus)  anogenital infection    PONV (postoperative nausea and vomiting)    Spinal headache 2009   With delivery of daughter. Pt had epidural.     Past Surgical History:  Procedure Laterality Date   CESAREAN SECTION  2009   CESAREAN SECTION N/A 06/10/2014   Procedure: CESAREAN SECTION;  Surgeon: Donna Just, DO;  Location: [facility];  Service: Obstetrics;  Laterality: N/A;   CESAREAN SECTION N/A 04/20/2016   Procedure: CESAREAN SECTION;  Surgeon: Glenys GORMAN Birk, MD;  Location: [facility] BIRTHING SUITES;  Service: Obstetrics;  Laterality: N/A;   CYSTOSCOPY N/A 11/02/2022   Procedure: CYSTOSCOPY;  Surgeon: Jeralyn Crutch, MD;  Location: [facility] OR;  Service: Gynecology;  Laterality: N/A;   IUD REMOVAL  11/02/2022   Procedure: INTRAUTERINE DEVICE (IUD) REMOVAL;  Surgeon: Jeralyn Crutch, MD;  Location: [facility];  Service: Gynecology;;   MYOMECTOMY     ROBOTIC ASSISTED TOTAL HYSTERECTOMY Bilateral 11/02/2022   Procedure: XI ROBOTIC ASSISTED TOTAL HYSTERECTOMY WITH BILATERAL SALPINGECTOMY/FILSHE CLIPS;  Surgeon: Jeralyn Crutch, MD;  Location: [facility];  Service: Gynecology;  Laterality: Bilateral;   TUBAL LIGATION Bilateral 04/20/2016   Procedure: BILATERAL TUBAL LIGATION;  Surgeon: Glenys GORMAN Birk, MD;  Location: [facility] BIRTHING SUITES;  Service: Obstetrics;  Laterality: Bilateral;   XI ROBOTIC ASSISTED VENTRAL HERNIA N/A 07/20/2021   Procedure: XI ROBOTIC ASSISTED VENTRAL HERNIA WITH MESH;  Surgeon: Desiderio Schanz, MD;  Location: [facility] ORS;  Service: General;  Laterality: N/A;  Family History  Problem Relation Age of Onset   Hyperlipidemia Maternal Grandfather    Hypertension Maternal Grandfather    Hypertension Father    Hyperlipidemia Mother    Diabetes Sister    Hyperlipidemia Sister    Ovarian cancer Maternal Aunt    Diabetes Maternal Aunt    Breast cancer Cousin 61    Social History   Tobacco Use   Smoking status: Never   Smokeless tobacco: Never  Vaping Use   Vaping status: Never Used  Substance  Use Topics   Alcohol use: Not Currently    Comment: Rarely   Drug use: No    ROS Refer to HPI for ROS details.  Objective:   Vitals: BP (!) 154/96   Pulse 75   Temp 98.8 F (37.1 C) (Oral)   Resp 16   LMP 10/17/2022 Comment: DOS UPERG NEGATIVE  SpO2 98%   Physical Exam Vitals and nursing note reviewed.  Constitutional:      General: She is not in acute distress.    Appearance: Normal appearance. She is well-developed. She is not ill-appearing or toxic-appearing.  HENT:     Head: Normocephalic and atraumatic.     Nose: Nose normal.     Mouth/Throat:     Mouth: Mucous membranes are moist.  Cardiovascular:     Rate and Rhythm: Normal rate and regular rhythm.     Heart sounds: Normal heart sounds. No murmur heard.    No friction rub. No gallop.  Pulmonary:     Effort: Pulmonary effort is normal. No respiratory distress.     Breath sounds: Normal breath sounds. No stridor. No wheezing, rhonchi or rales.  Chest:     Chest wall: No tenderness.  Skin:    General: Skin is warm and dry.  Neurological:     General: No focal deficit present.     Mental Status: She is alert and oriented to person, place, and time. Mental status is at baseline.     Cranial Nerves: No cranial nerve deficit.     Sensory: No sensory deficit.     Motor: No weakness.  Psychiatric:        Mood and Affect: Mood normal.        Behavior: Behavior normal.        Thought Content: Thought content normal.        Judgment: Judgment normal.     Procedures  No results found for this or any previous visit (from the past 24 hours).  DG Chest 2 View Result Date: 05/26/2024 CLINICAL DATA:  Dyspnea EXAM: CHEST - 2 VIEW COMPARISON:  None Available. FINDINGS: The cardiomediastinal silhouette is mildly enlarged in contour. No pleural effusion. No pneumothorax. Diffuse interstitial prominence with peribronchial cuffing. Visualized abdomen is unremarkable. Mild degenerative changes of the lower inner thoracic  spine. IMPRESSION: Constellation of findings are favored to reflect pulmonary edema. Differential considerations include atypical infection. Electronically Signed   By: Corean Salter M.D.   On: 05/26/2024 14:12     Assessment and Plan :     Discharge Instructions       1. Acute pulmonary edema (HCC) (Primary) - EKG 12-Lead completed in UC shows normal sinus rhythm with ventricular rate of 67 bpm, normal EKG, no STEMI. - DG Chest 2 View x-ray completed in UC is suggestive of pulmonary edema versus atypical pneumonia. - Based on atypical symptoms of intermittent shortness of breath and syncopal feeling recommend follow-up in emergency department for further evaluation and treatment. -  Appropriate treatment and evaluation is beyond the capabilities of urgent care due to need of advanced imaging and stat laboratory testing to determine potential causes of your symptoms. - Please go directly to emergency room after leaving urgent care for further evaluation and treatment.       Talayla Doyel B Kuper Rennels   Risha Barretta, [place], [place] 05/26/24 1458

## 2024-05-26 NOTE — ED Notes (Signed)
 Patient is being discharged from the Urgent Care and sent to the Emergency Department via private vehicle . Per DOROTHA Aguas, NP, patient is in need of higher level of care due to dyspnea with questionable pulmonary edema via CXR. Patient is aware and verbalizes understanding of plan of care.  Vitals:   05/26/24 1349  BP: (!) 154/96  Pulse: 75  Resp: 16  Temp: 98.8 F (37.1 C)  SpO2: 98%

## 2024-05-27 ENCOUNTER — Ambulatory Visit (HOSPITAL_COMMUNITY): Payer: Self-pay
